# Patient Record
Sex: Female | Born: 1995 | Race: White | Hispanic: No | Marital: Single | Smoking: Former smoker
Health system: Southern US, Community
[De-identification: ages and names within clinical notes are randomized; demographics above are authoritative.]

## PROBLEM LIST (undated history)

## (undated) ENCOUNTER — Inpatient Hospital Stay (HOSPITAL_COMMUNITY): Payer: Self-pay

## (undated) DIAGNOSIS — G43909 Migraine, unspecified, not intractable, without status migrainosus: Secondary | ICD-10-CM

## (undated) DIAGNOSIS — M549 Dorsalgia, unspecified: Secondary | ICD-10-CM

## (undated) HISTORY — PX: NO PAST SURGERIES: SHX2092

---
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
## 2018-02-12 ENCOUNTER — Encounter: Payer: Self-pay | Admitting: Family Medicine

## 2018-02-12 ENCOUNTER — Ambulatory Visit (INDEPENDENT_AMBULATORY_CARE_PROVIDER_SITE_OTHER): Payer: Medicaid Other | Admitting: *Deleted

## 2018-02-12 DIAGNOSIS — Z3200 Encounter for pregnancy test, result unknown: Secondary | ICD-10-CM

## 2018-02-12 DIAGNOSIS — Z3201 Encounter for pregnancy test, result positive: Secondary | ICD-10-CM | POA: Diagnosis not present

## 2018-02-12 LAB — POCT PREGNANCY, URINE: Preg Test, Ur: POSITIVE — AB

## 2018-02-12 NOTE — Progress Notes (Signed)
I have reviewed this chart and agree with the RN/CMA assessment and management.    Josean Lycan C Nkechi Linehan, MD, FACOG Attending Physician, [facility]  

## 2018-02-12 NOTE — Progress Notes (Signed)
Here for pregnancy test which was positive. LMP 12/15/17 or could have been 12/8 or 12/10 . This makes her [redacted]w[redacted]d with EDD 09/21/18. Advised to start prenatal care and prenatal vitamins. She is not on any meds currently.  Linda,RN

## 2018-02-15 ENCOUNTER — Encounter (HOSPITAL_COMMUNITY): Payer: Self-pay | Admitting: *Deleted

## 2018-02-15 ENCOUNTER — Inpatient Hospital Stay (HOSPITAL_COMMUNITY)
Admission: AD | Admit: 2018-02-15 | Discharge: 2018-02-15 | Disposition: A | Discharge status: Home / Self Care | Payer: Medicaid Other | Source: Ambulatory Visit | Attending: Obstetrics & Gynecology | Admitting: Obstetrics & Gynecology

## 2018-02-15 ENCOUNTER — Inpatient Hospital Stay (HOSPITAL_COMMUNITY): Payer: Medicaid Other

## 2018-02-15 DIAGNOSIS — O3680X0 Pregnancy with inconclusive fetal viability, not applicable or unspecified: Secondary | ICD-10-CM | POA: Diagnosis not present

## 2018-02-15 DIAGNOSIS — R109 Unspecified abdominal pain: Secondary | ICD-10-CM | POA: Insufficient documentation

## 2018-02-15 DIAGNOSIS — Z3A08 8 weeks gestation of pregnancy: Secondary | ICD-10-CM | POA: Insufficient documentation

## 2018-02-15 DIAGNOSIS — O26899 Other specified pregnancy related conditions, unspecified trimester: Secondary | ICD-10-CM

## 2018-02-15 DIAGNOSIS — O26891 Other specified pregnancy related conditions, first trimester: Secondary | ICD-10-CM

## 2018-02-15 DIAGNOSIS — Z6790 Unspecified blood type, Rh positive: Secondary | ICD-10-CM

## 2018-02-15 DIAGNOSIS — Z3A01 Less than 8 weeks gestation of pregnancy: Secondary | ICD-10-CM | POA: Diagnosis not present

## 2018-02-15 DIAGNOSIS — O209 Hemorrhage in early pregnancy, unspecified: Secondary | ICD-10-CM | POA: Diagnosis present

## 2018-02-15 LAB — URINALYSIS, MICROSCOPIC (REFLEX)
Bacteria, UA: NONE SEEN
WBC, UA: NONE SEEN WBC/hpf (ref 0–5)

## 2018-02-15 LAB — URINALYSIS, ROUTINE W REFLEX MICROSCOPIC
BILIRUBIN URINE: NEGATIVE
Glucose, UA: NEGATIVE mg/dL
Ketones, ur: NEGATIVE mg/dL
Leukocytes, UA: NEGATIVE
Nitrite: NEGATIVE
Protein, ur: NEGATIVE mg/dL
SPECIFIC GRAVITY, URINE: 1.025 (ref 1.005–1.030)
pH: 5.5 (ref 5.0–8.0)

## 2018-02-15 LAB — WET PREP, GENITAL
Clue Cells Wet Prep HPF POC: NONE SEEN
SPERM: NONE SEEN
TRICH WET PREP: NONE SEEN
WBC WET PREP: NONE SEEN
YEAST WET PREP: NONE SEEN

## 2018-02-15 LAB — CBC
HEMATOCRIT: 38.5 % (ref 36.0–46.0)
HEMOGLOBIN: 12.4 g/dL (ref 12.0–15.0)
MCH: 27.9 pg (ref 26.0–34.0)
MCHC: 32.2 g/dL (ref 30.0–36.0)
MCV: 86.7 fL (ref 80.0–100.0)
NRBC: 0 % (ref 0.0–0.2)
Platelets: 241 10*3/uL (ref 150–400)
RBC: 4.44 MIL/uL (ref 3.87–5.11)
RDW: 14.4 % (ref 11.5–15.5)
WBC: 7.4 10*3/uL (ref 4.0–10.5)

## 2018-02-15 LAB — HCG, QUANTITATIVE, PREGNANCY: hCG, Beta Chain, Quant, S: 1527 m[IU]/mL — ABNORMAL HIGH (ref <5)

## 2018-02-15 LAB — ABO/RH: ABO/RH(D): O POS

## 2018-02-15 NOTE — MAU Note (Signed)
Samantha Andrews is a 23 y.o.  here in MAU reporting:  +vaginal bleeding: spotting. Not having to wear a pad. At first was brown and now red when wipes. LMP: 12/15/17 +lower abdominal cramping earlier but not now. Onset of complaint: x2 days Pain score: denies at this time. 5/10 earlier. Vitals:   02/15/18 0718  BP: 134/76  Pulse: 68  Resp: 18  SpO2: 100%

## 2018-02-15 NOTE — MAU Provider Note (Signed)
History     CSN: [id number]  Arrival date and time: 02/15/18 [id number]   First Provider Initiated Contact with Patient 02/15/18 [phone]      Chief Complaint  Patient presents with  . Vaginal Bleeding   G1 @[redacted]w[redacted]d  by LMP presenting with VB and cramping. Bleeding started 2 days ago. Was brown initially but became red today. Mostly when she wipes. Has not needed a pad. Cramping started today. Located in lower abdomen, intermittent. Feels like period cramps. Has not taken anything for it. Denies urinary sx.    OB History    Gravida  1   Para      Term      Preterm      AB      Living        SAB      TAB      Ectopic      Multiple      Live Births              Past Medical History:  Diagnosis Date  . Back pain   . Migraines     Past Surgical History:  Procedure Laterality Date  . NO PAST SURGERIES      History reviewed. No pertinent family history.  Social History   Tobacco Use  . Smoking status: Never Smoker  . Smokeless tobacco: Never Used  Substance Use Topics  . Alcohol use: No  . Drug use: Yes    Types: Marijuana    Allergies: No Known Allergies  No medications prior to admission.    Review of Systems  Constitutional: Negative for chills and fever.  Gastrointestinal: Positive for abdominal pain, constipation and nausea. Negative for diarrhea and vomiting.  Genitourinary: Positive for vaginal bleeding. Negative for dysuria and hematuria.  Musculoskeletal: Negative for back pain.   Physical Exam   Blood pressure 128/76, pulse 90, temperature 98.4 F (36.9 C), resp. rate 18, weight 83 kg, last menstrual period 12/15/2017, SpO2 100 %.  Physical Exam  Constitutional: She is oriented to person, place, and time. She appears well-developed and well-nourished. No distress.  HENT:  Head: Normocephalic and atraumatic.  Neck: Normal range of motion.  Cardiovascular: Normal rate.  Respiratory: Effort normal. No respiratory distress.  GI: Soft. She  exhibits no distension and no mass. There is no abdominal tenderness. There is no rebound and no guarding.  Genitourinary:    Genitourinary Comments: External: no lesions or erythema Vagina: rugated, pink, moist, small drk bloody discharge w/tiny clots Uterus: non enlarged, anteverted, non tender, no CMT Adnexae: no masses, no tenderness left, no tenderness right Cervix closed    Musculoskeletal: Normal range of motion.  Neurological: She is alert and oriented to person, place, and time.  Skin: Skin is warm and dry.  Psychiatric: She has a normal mood and affect.   Results for orders placed or performed during the hospital encounter of 02/15/18 (from the past 24 hour(s))  Wet prep, genital     Status: None   Collection Time: 02/15/18  8:22 AM  Result Value Ref Range   Yeast Wet Prep HPF POC NONE SEEN NONE SEEN   Trich, Wet Prep NONE SEEN NONE SEEN   Clue Cells Wet Prep HPF POC NONE SEEN NONE SEEN   WBC, Wet Prep HPF POC NONE SEEN NONE SEEN   Sperm NONE SEEN   Urinalysis, Routine w reflex microscopic     Status: Abnormal   Collection Time: 02/15/18  8:25 AM  Result Value Ref  Range   Color, Urine YELLOW YELLOW   APPearance CLEAR CLEAR   Specific Gravity, Urine 1.025 1.005 - 1.030   pH 5.5 5.0 - 8.0   Glucose, UA NEGATIVE NEGATIVE mg/dL   Hgb urine dipstick LARGE (A) NEGATIVE   Bilirubin Urine NEGATIVE NEGATIVE   Ketones, ur NEGATIVE NEGATIVE mg/dL   Protein, ur NEGATIVE NEGATIVE mg/dL   Nitrite NEGATIVE NEGATIVE   Leukocytes, UA NEGATIVE NEGATIVE  Urinalysis, Microscopic (reflex)     Status: None   Collection Time: 02/15/18  8:25 AM  Result Value Ref Range   RBC / HPF 11-20 0 - 5 RBC/hpf   WBC, UA NONE SEEN 0 - 5 WBC/hpf   Bacteria, UA NONE SEEN NONE SEEN   Squamous Epithelial / LPF 0-5 0 - 5   Mucus PRESENT   CBC     Status: None   Collection Time: 02/15/18  8:33 AM  Result Value Ref Range   WBC 7.4 4.0 - 10.5 K/uL   RBC 4.44 3.87 - 5.11 MIL/uL   Hemoglobin 12.4 12.0 -  15.0 g/dL   HCT 26.8 34.1 - 96.2 %   MCV 86.7 80.0 - 100.0 fL   MCH 27.9 26.0 - 34.0 pg   MCHC 32.2 30.0 - 36.0 g/dL   RDW 22.9 79.8 - 92.1 %   Platelets 241 150 - 400 K/uL   nRBC 0.0 0.0 - 0.2 %  hCG, quantitative, pregnancy     Status: Abnormal   Collection Time: 02/15/18  8:33 AM  Result Value Ref Range   hCG, Beta Chain, Quant, S 1,527 (H) <5 mIU/mL  ABO/Rh     Status: None (Preliminary result)   Collection Time: 02/15/18  8:33 AM  Result Value Ref Range   ABO/RH(D)      O POS Performed at [facility], [address] [place], [place] [address]    US Ob Less Than 14 Weeks With Ob Transvaginal  Result Date: 02/15/2018 CLINICAL DATA:  Pregnant with abdominal pain. EXAM: OBSTETRIC <14 WK [place] AND TRANSVAGINAL OB US TECHNIQUE: Both transabdominal and transvaginal ultrasound examinations were performed for complete evaluation of the gestation as well as the maternal uterus, adnexal regions, and pelvic cul-de-sac. Transvaginal technique was performed to assess early pregnancy. COMPARISON:  None. FINDINGS: Intrauterine gestational sac: Present Yolk sac:  None Embryo:  None Cardiac Activity: N/A Heart Rate: N/A bpm MSD: 6.4 mm   5 w   2 d Subchorionic hemorrhage:  None visualized. Maternal uterus/adnexae: The ovaries are normal. IMPRESSION: Probable early intrauterine gestational sac, but no yolk sac or embryonic pole. Recommend follow-up quantitative B-HCG levels and follow-up US in 14 days to assess viability. This recommendation follows SRU consensus guidelines: Diagnostic Criteria for Nonviable Pregnancy Early in the First Trimester. Malva Limes Med 2013; 408:1448-18. Electronically Signed   By: Rudie Meyer M.D.   On: 02/15/2018 09:22    MAU Course  Procedures Orders Placed This Encounter  Procedures  . Wet prep, genital    Standing Status:   Standing    Number of Occurrences:   1    Order Specific Question:   Patient immune status    Answer:   Normal  . US OB LESS THAN 14 WEEKS  WITH OB TRANSVAGINAL    Standing Status:   Standing    Number of Occurrences:   1    Order Specific Question:   Symptom/Reason for Exam    Answer:   Abdominal pain affecting pregnancy [[id number]]  . Urinalysis,  Routine w reflex microscopic    Standing Status:   Standing    Number of Occurrences:   1  . CBC    Standing Status:   Standing    Number of Occurrences:   1  . hCG, quantitative, pregnancy    Standing Status:   Standing    Number of Occurrences:   1  . Urinalysis, Microscopic (reflex)    Standing Status:   Standing    Number of Occurrences:   1  . ABO/Rh    Standing Status:   Standing    Number of Occurrences:   1  . Discharge patient    Order Specific Question:   Discharge disposition    Answer:   01-Home or Self Care [1]    Order Specific Question:   Discharge patient date    Answer:   02/15/2018   MDM Labs and [place] ordered and reviewed. IUGS smaller than dates seen on [place] but no YS, FP, or adnexal mass, suspect failed pregnancy but findings could indicate early pregnancy or ectopic pregnancy-discussed with pt. Will follow quant in 48 hrs. Stable for discharge home.  Assessment and Plan   1. Pregnancy, location unknown   2. Abdominal pain affecting pregnancy   3. Blood type, Rh positive    Discharge home Follow up in [facility] in 2 days for qhcg Ectopic/SAB precautions  Allergies as of 02/15/2018   No Known Allergies     Medication List    STOP taking these medications   ibuprofen 200 MG tablet Commonly known as:  ADVIL,MOTRIN     TAKE these medications   citalopram 20 MG tablet Commonly known as:  CELEXA Take 20 mg by mouth daily.   ondansetron 4 MG disintegrating tablet Commonly known as:  ZOFRAN ODT 4mg  ODT q4 hours prn nausea/vomit   SUMAtriptan 100 MG tablet Commonly known as:  IMITREX Take 100 mg by mouth every 2 (two) hours as needed for migraine. May repeat in 2 hours if headache persists or recurs.      Donette Larry, CNM 02/15/2018, 9:52 AM

## 2018-02-15 NOTE — Discharge Instructions (Signed)
Vaginal Bleeding During Pregnancy, First Trimester    A small amount of bleeding (spotting) from the vagina is common during early pregnancy. Sometimes the bleeding is normal and does not cause problems. At other times, though, bleeding may be a sign of something serious. Tell your doctor about any bleeding from your vagina right away.  Follow these instructions at home:  Activity  · Follow your doctor's instructions about how active you can be.  · If needed, make plans for someone to help with your normal activities.  · Do not have sex or orgasms until your doctor says that this is safe.  General instructions  · Take over-the-counter and prescription medicines only as told by your doctor.  · Watch your condition for any changes.  · Write down:  ? The number of pads you use each day.  ? How often you change pads.  ? How soaked (saturated) your pads are.  · Do not use tampons.  · Do not douche.  · If you pass any tissue from your vagina, save it to show to your doctor.  · Keep all follow-up visits as told by your doctor. This is important.  Contact a doctor if:  · You have vaginal bleeding at any time while you are pregnant.  · You have cramps.  · You have a fever.  Get help right away if:  · You have very bad cramps in your back or belly (abdomen).  · You pass large clots or a lot of tissue from your vagina.  · Your bleeding gets worse.  · You feel light-headed.  · You feel weak.  · You pass out (faint).  · You have chills.  · You are leaking fluid from your vagina.  · You have a gush of fluid from your vagina.  Summary  · Sometimes vaginal bleeding during pregnancy is normal and does not cause problems. At other times, bleeding may be a sign of something serious.  · Tell your doctor about any bleeding from your vagina right away.  · Follow your doctor's instructions about how active you can be. You may need someone to help you with your normal activities.  This information is not intended to replace advice given to  you by your health care provider. Make sure you discuss any questions you have with your health care provider.  Document Released: 05/11/2013 Document Revised: 03/28/2016 Document Reviewed: 03/28/2016  Elsevier Interactive Patient Education © 2019 [employer]

## 2018-02-17 ENCOUNTER — Ambulatory Visit (INDEPENDENT_AMBULATORY_CARE_PROVIDER_SITE_OTHER): Payer: Medicaid Other | Admitting: *Deleted

## 2018-02-17 DIAGNOSIS — O3680X0 Pregnancy with inconclusive fetal viability, not applicable or unspecified: Secondary | ICD-10-CM

## 2018-02-17 DIAGNOSIS — O0289 Other abnormal products of conception: Secondary | ICD-10-CM

## 2018-02-17 LAB — HCG, QUANTITATIVE, PREGNANCY: HCG, BETA CHAIN, QUANT, S: 1780 m[IU]/mL — AB (ref <5)

## 2018-02-17 LAB — GC/CHLAMYDIA PROBE AMP (~~LOC~~) NOT AT ARMC
Chlamydia: POSITIVE — AB
Neisseria Gonorrhea: NEGATIVE

## 2018-02-17 MED ORDER — MISOPROSTOL 200 MCG PO TABS: ORAL_TABLET | ORAL | 1 refills | Status: DC

## 2018-02-17 MED ORDER — MISOPROSTOL 200 MCG PO TABS
ORAL_TABLET | ORAL | 1 refills | Status: DC
Start: 2018-02-17 — End: 2018-02-17

## 2018-02-17 NOTE — Progress Notes (Signed)
Here for stat bhcg. Denies pain; states only having mild cramps like with period. C/o  Bleeding like a period since 02/15/18 mau visit. States it feels like a period. Explained we will draw stat bhcg and have her wait in lobby for results which we will then discuss with provider and then her. She voices understanding.  Kay Ricciuti,RN

## 2018-02-17 NOTE — Progress Notes (Signed)
Reviewed results with Dr. Adrian Blackwater and informed patient her bhcg indicates inappropriate rise in bhcg and indicated non viable pregnancy..  Informed her we recommend cytotec and then follow up in 2 weeks.Explained she will pass blood , clots and perhaps tissue within 48 hours- if she does not she should repeat the cytotec.   Support given. She voices understanding.  Legrand Como

## 2018-02-17 NOTE — Progress Notes (Signed)
I reviewed the chart with RN. Chart reviewed - agree with RN documentation.

## 2018-02-19 ENCOUNTER — Telehealth: Payer: Self-pay | Admitting: Certified Nurse Midwife

## 2018-02-19 NOTE — Telephone Encounter (Signed)
Duplicate. Attempt made to contact pt previously today by RN.

## 2018-02-20 ENCOUNTER — Encounter (HOSPITAL_COMMUNITY): Payer: Self-pay | Admitting: Student

## 2018-02-20 DIAGNOSIS — A749 Chlamydial infection, unspecified: Secondary | ICD-10-CM | POA: Insufficient documentation

## 2018-02-20 HISTORY — DX: Chlamydial infection, unspecified: A74.9

## 2018-03-04 ENCOUNTER — Ambulatory Visit: Payer: Medicaid Other | Admitting: Student

## 2018-07-16 ENCOUNTER — Encounter: Payer: Self-pay | Admitting: *Deleted

## 2018-07-16 ENCOUNTER — Other Ambulatory Visit: Payer: Self-pay

## 2018-07-16 ENCOUNTER — Emergency Department: Payer: Medicaid Other

## 2018-07-16 DIAGNOSIS — O200 Threatened abortion: Secondary | ICD-10-CM | POA: Diagnosis not present

## 2018-07-16 DIAGNOSIS — Z79899 Other long term (current) drug therapy: Secondary | ICD-10-CM | POA: Diagnosis not present

## 2018-07-16 DIAGNOSIS — Z3A01 Less than 8 weeks gestation of pregnancy: Secondary | ICD-10-CM | POA: Diagnosis not present

## 2018-07-16 DIAGNOSIS — O209 Hemorrhage in early pregnancy, unspecified: Secondary | ICD-10-CM | POA: Diagnosis present

## 2018-07-16 LAB — CBC
HCT: 35.6 % — ABNORMAL LOW (ref 36.0–46.0)
Hemoglobin: 11.4 g/dL — ABNORMAL LOW (ref 12.0–15.0)
MCH: 26.6 pg (ref 26.0–34.0)
MCHC: 32 g/dL (ref 30.0–36.0)
MCV: 83 fL (ref 80.0–100.0)
Platelets: 271 10*3/uL (ref 150–400)
RBC: 4.29 MIL/uL (ref 3.87–5.11)
RDW: 13.1 % (ref 11.5–15.5)
WBC: 11.5 10*3/uL — ABNORMAL HIGH (ref 4.0–10.5)
nRBC: 0 % (ref 0.0–0.2)

## 2018-07-16 LAB — COMPREHENSIVE METABOLIC PANEL
ALT: 17 U/L (ref 0–44)
AST: 19 U/L (ref 15–41)
Albumin: 4.3 g/dL (ref 3.5–5.0)
Alkaline Phosphatase: 51 U/L (ref 38–126)
Anion gap: 5 (ref 5–15)
BUN: 9 mg/dL (ref 6–20)
CO2: 27 mmol/L (ref 22–32)
Calcium: 9.2 mg/dL (ref 8.9–10.3)
Chloride: 106 mmol/L (ref 98–111)
Creatinine, Ser: 0.62 mg/dL (ref 0.44–1.00)
GFR calc Af Amer: >60 mL/min (ref >60)
GFR calc non Af Amer: >60 mL/min (ref >60)
Glucose, Bld: 84 mg/dL (ref 70–99)
Potassium: 3.7 mmol/L (ref 3.5–5.1)
Sodium: 138 mmol/L (ref 135–145)
Total Bilirubin: 0.4 mg/dL (ref 0.3–1.2)
Total Protein: 7.2 g/dL (ref 6.5–8.1)

## 2018-07-16 LAB — ABO/RH: ABO/RH(D): O POS

## 2018-07-16 LAB — HCG, QUANTITATIVE, PREGNANCY: hCG, Beta Chain, Quant, S: 2046 m[IU]/mL — ABNORMAL HIGH (ref <5)

## 2018-07-16 LAB — LIPASE, BLOOD: Lipase: 33 U/L (ref 11–51)

## 2018-07-16 NOTE — ED Triage Notes (Signed)
Pt to ED reporting slow vaginal bleeding throughout the day today that has turned into  Sharp pelvic and lower abd pains within the last hour with blood clots noted the last time pt went to the bathroom. Pt reports a miscarriage earlier this year that felt similar. Pt tearful in triage and states she just found out she was pregnant this weekend and denies knowing how far along she is.

## 2018-07-17 ENCOUNTER — Emergency Department
Admission: EM | Admit: 2018-07-17 | Discharge: 2018-07-17 | Disposition: A | Discharge status: Home / Self Care | Payer: Medicaid Other | Attending: Emergency Medicine | Admitting: Emergency Medicine

## 2018-07-17 DIAGNOSIS — O4690 Antepartum hemorrhage, unspecified, unspecified trimester: Secondary | ICD-10-CM

## 2018-07-17 DIAGNOSIS — O200 Threatened abortion: Secondary | ICD-10-CM

## 2018-07-17 NOTE — ED Provider Notes (Signed)
[facility] Emergency Department Provider Note   ____________________________________________   First MD Initiated Contact with Patient 07/17/18 0240     (approximate)  I have reviewed the triage vital signs and the nursing notes.   HISTORY  Chief Complaint Vaginal Bleeding    HPI Samantha Andrews is a 23 y.o. female G2 P0 Ab1 who presents with positive home pregnancy test, vaginal spotting and pelvic cramps.  Patient reports miscarriage in January of this year.  Last menstrual period around May 24.  Denies recent sexual intercourse, vaginal discharge, dizziness, fever, cough, chest pain, shortness of breath.  Denies recent travel, trauma or exposure to persons diagnosed with coronavirus.       Past Medical History:  Diagnosis Date  . Back pain   . Migraines     Patient Active Problem List   Diagnosis Date Noted  . Chlamydia 02/20/2018    Past Surgical History:  Procedure Laterality Date  . NO PAST SURGERIES      Prior to Admission medications   Medication Sig Start Date End Date Taking? Authorizing Provider  citalopram (CELEXA) 20 MG tablet Take 20 mg by mouth daily.    [provider]  misoprostol (CYTOTEC) 200 MCG tablet Place 2 tablets in each side of mouth between gum and cheek and allow to dissolve. May repeat in 3 days if no results. 02/17/18   Levie HeritageStinson, Jacob J, DO  ondansetron (ZOFRAN ODT) 4 MG disintegrating tablet 4mg  ODT q4 hours prn nausea/vomit 03/19/15   Blane OharaZavitz, Joshua, MD  SUMAtriptan (IMITREX) 100 MG tablet Take 100 mg by mouth every 2 (two) hours as needed for migraine. May repeat in 2 hours if headache persists or recurs.    [provider]    Allergies Patient has no known allergies.  History reviewed. No pertinent family history.  Social History Social History   Tobacco Use  . Smoking status: Never Smoker  . Smokeless tobacco: Never Used  Substance Use Topics  . Alcohol use: No  . Drug use:  Yes    Types: Marijuana    Review of Systems  Constitutional: No fever/chills Eyes: No visual changes. ENT: No sore throat. Cardiovascular: Denies chest pain. Respiratory: Denies shortness of breath. Gastrointestinal: Positive for pelvic cramps.  No abdominal pain.  No nausea, no vomiting.  No diarrhea.  No constipation. Genitourinary: Positive for vaginal bleeding.  Negative for dysuria. Musculoskeletal: Negative for back pain. Skin: Negative for rash. Neurological: Negative for headaches, focal weakness or numbness.   ____________________________________________   PHYSICAL EXAM:  VITAL SIGNS: ED Triage Vitals [07/16/18 2226]  Enc Vitals Group     BP 139/71     Pulse Rate (!) 115     Resp 16     Temp 98.7 F (37.1 C)     Temp Source Oral     SpO2 100 %     Weight 180 lb (81.6 kg)     Height 5\' 3"  (1.6 m)     Head Circumference      Peak Flow      Pain Score 8     Pain Loc      Pain Edu?      Excl. in [place]?     Constitutional: Asleep, awakened for exam.  Alert and oriented. Well appearing and in no acute distress. Eyes: Conjunctivae are normal. PERRL. EOMI. Head: Atraumatic. Nose: No congestion/rhinnorhea. Mouth/Throat: Mucous membranes are moist.  Oropharynx non-erythematous. Neck: No stridor.   Cardiovascular: Normal rate, regular rhythm. Grossly  normal heart sounds.  Good peripheral circulation. Respiratory: Normal respiratory effort.  No retractions. Lungs CTAB. Gastrointestinal: Soft and nontender to light or deep palpation. No distention. No abdominal bruits. No CVA tenderness. Musculoskeletal: No lower extremity tenderness nor edema.  No joint effusions. Neurologic:  Normal speech and language. No gross focal neurologic deficits are appreciated. No gait instability. Skin:  Skin is warm, dry and intact. No rash noted. Psychiatric: Mood and affect are normal. Speech and behavior are normal.  ____________________________________________   LABS (all labs  ordered are listed, but only abnormal results are displayed)  Labs Reviewed  CBC - Abnormal; Notable for the following components:      Result Value   WBC 11.5 (*)    Hemoglobin 11.4 (*)    HCT 35.6 (*)    All other components within normal limits  HCG, QUANTITATIVE, PREGNANCY - Abnormal; Notable for the following components:   hCG, Beta Chain, Quant, S 2,046 (*)    All other components within normal limits  LIPASE, BLOOD  COMPREHENSIVE METABOLIC PANEL  URINALYSIS, COMPLETE (UACMP) WITH MICROSCOPIC  ABO/RH   ____________________________________________  EKG  None ____________________________________________  RADIOLOGY  ED MD interpretation: Probable early intrauterine gestational sac but no cardiac activity yet visualized.  Official radiology report(s): [place] Ob Comp Less 14 Wks  Result Date: 07/17/2018 CLINICAL DATA:  Vaginal bleeding EXAM: OBSTETRIC <14 WK US AND TRANSVAGINAL OB US TECHNIQUE: Both transabdominal and transvaginal ultrasound examinations were performed for complete evaluation of the gestation as well as the maternal uterus, adnexal regions, and pelvic cul-de-sac. Transvaginal technique was performed to assess early pregnancy. COMPARISON:  02/15/2018 FINDINGS: Intrauterine gestational sac: Single Yolk sac:  Not Visualized. Embryo:  Not Visualized. Cardiac Activity: Not Visualized. MSD: 5.1 mm   5 w   2 d Subchorionic hemorrhage:  None visualized. Maternal uterus/adnexae: There is no acute maternal abnormality. IMPRESSION: Probable early intrauterine gestational sac, but no yolk sac, fetal pole, or cardiac activity yet visualized. Recommend follow-up quantitative B-HCG levels and follow-up US in 14 days to assess viability. This recommendation follows SRU consensus guidelines: Diagnostic Criteria for Nonviable Pregnancy Early in the First Trimester. Malva Limes Engl J Med 2013; 782:9562-13; 369:1443-51. Electronically Signed   By: Katherine Mantlehristopher  Green M.D.   On: 07/17/2018 00:36   Koreas Ob  Transvaginal  Result Date: 07/17/2018 CLINICAL DATA:  Vaginal bleeding EXAM: OBSTETRIC <14 WK US AND TRANSVAGINAL OB US TECHNIQUE: Both transabdominal and transvaginal ultrasound examinations were performed for complete evaluation of the gestation as well as the maternal uterus, adnexal regions, and pelvic cul-de-sac. Transvaginal technique was performed to assess early pregnancy. COMPARISON:  02/15/2018 FINDINGS: Intrauterine gestational sac: Single Yolk sac:  Not Visualized. Embryo:  Not Visualized. Cardiac Activity: Not Visualized. MSD: 5.1 mm   5 w   2 d Subchorionic hemorrhage:  None visualized. Maternal uterus/adnexae: There is no acute maternal abnormality. IMPRESSION: Probable early intrauterine gestational sac, but no yolk sac, fetal pole, or cardiac activity yet visualized. Recommend follow-up quantitative B-HCG levels and follow-up US in 14 days to assess viability. This recommendation follows SRU consensus guidelines: Diagnostic Criteria for Nonviable Pregnancy Early in the First Trimester. Malva Limes Engl J Med 2013; [id number]:5784-69; 369:1443-51. Electronically Signed   By: Katherine Mantlehristopher  Green M.D.   On: 07/17/2018 00:36    ____________________________________________   PROCEDURES  Procedure(s) performed (including Critical Care):  Procedures  Pelvic deferred; patient states she is no longer bleeding ____________________________________________   INITIAL IMPRESSION / ASSESSMENT AND PLAN / ED COURSE  As part of my medical  decision making, I reviewed the following data within the [place] notes reviewed and incorporated, Labs reviewed, Old chart reviewed, Radiograph reviewed and Notes from prior ED visits     Samantha Andrews was evaluated in Emergency Department on 07/17/2018 for the symptoms described in the history of present illness. She was evaluated in the context of the global COVID-19 pandemic, which necessitated consideration that the patient might be at risk for  infection with the SARS-CoV-2 virus that causes COVID-19. Institutional protocols and algorithms that pertain to the evaluation of patients at risk for COVID-19 are in a state of rapid change based on information released by regulatory bodies including the CDC and federal and state organizations. These policies and algorithms were followed during the patient's care in the ED.   23 year old G2, P0 approximately [redacted] weeks pregnant who presents with vaginal spotting. Differential diagnosis includes, but is not limited to, ovarian cyst, ovarian torsion, acute appendicitis, diverticulitis, urinary tract infection/pyelonephritis, endometriosis, bowel obstruction, colitis, renal colic, gastroenteritis, hernia, fibroids, endometriosis, pregnancy related pain including ectopic pregnancy, etc.  Discussed with patient lab work and ultrasound results.  She will return to outpatient laboratory in 48 hours for repeat beta-hCG.  She will follow-up either with [facility] in [place] as she has previously or local referral provided.  Strict return precautions given.  Patient verbalizes understanding agrees with plan of care.      ____________________________________________   FINAL CLINICAL IMPRESSION(S) / ED DIAGNOSES  Final diagnoses:  Vaginal bleeding in pregnancy  Threatened miscarriage     ED Discharge Orders    None       Note:  This document was prepared using Dragon voice recognition software and may include unintentional dictation errors.   Paulette Blanch, MD 07/17/18 [phone]

## 2018-07-17 NOTE — ED Notes (Signed)
ED Provider at bedside. 

## 2018-07-17 NOTE — Discharge Instructions (Addendum)
Plenty of fluids daily and practice pelvic rest.  You may return to outpatient lab on Saturday between  8 AM to noon for repeat blood draw.  Your blood pregnancy level tonight is 2,046. Return to the ER for worsening symptoms, soaking more than 1 pad per hour, fainting or other concerns.

## 2018-07-21 ENCOUNTER — Telehealth: Payer: Self-pay | Admitting: Family Medicine

## 2018-07-21 NOTE — Telephone Encounter (Signed)
The patient stated she missed her appointment at the ER. She also stated she has been around someone that tested positive for Covid19. She stated the last time she was near the patient was Friday. Informed the patient of coming to the window once she enters the clinic. Also be sure to wear a face mask and no visitors/children allowed due to restrictions.

## 2018-07-22 ENCOUNTER — Telehealth: Payer: Self-pay | Admitting: Obstetrics and Gynecology

## 2018-07-22 ENCOUNTER — Ambulatory Visit (INDEPENDENT_AMBULATORY_CARE_PROVIDER_SITE_OTHER): Payer: Self-pay | Admitting: General Practice

## 2018-07-22 ENCOUNTER — Other Ambulatory Visit: Payer: Medicaid Other

## 2018-07-22 ENCOUNTER — Other Ambulatory Visit: Payer: Self-pay

## 2018-07-22 DIAGNOSIS — O3680X0 Pregnancy with inconclusive fetal viability, not applicable or unspecified: Secondary | ICD-10-CM | POA: Diagnosis not present

## 2018-07-22 LAB — BETA HCG QUANT (REF LAB): hCG Quant: 81 m[IU]/mL

## 2018-07-22 NOTE — Telephone Encounter (Signed)
Called the patient to inform of the stat being completed from her car. Informed the patient to call our office once she arrives and we will complete the visit at the time. The patient verbalized understanding.

## 2018-07-22 NOTE — Progress Notes (Signed)
Subjective:  Samantha Andrews is a 23 y.o. G1P0 at Unknown who presents today for FU BHCG. She was seen on 07/16/2018.  Results from that day show no IUP on [place], gestational sac only, and HCG 2046. She reports light vaginal bleeding. She denies abdominal or pelvic pain.   Objective:  Physical Exam  Nursing note and vitals reviewed. Constitutional: She is oriented to person, place, and time. She appears    Results for orders placed or performed in visit on 07/22/18 (from the past 24 hour(s))  Beta hCG quant (ref lab)     Status: None   Collection Time: 07/22/18 11:17 AM  Result Value Ref Range   hCG Quant 81 mIU/mL   Narrative   Performed at:  [facility] [address], [place], [place]  [id number] Lab Director: Lindon Romp MD, Phone:  [phone]    Assessment/Plan: Pregnancy of unknown location: SAB in process  HCG did not rise appropriately.  FU in 1 weeks for: Stat Quant. Appointment made for Wednesday 7/22 @ 1400  Rasch, Artist Pais, NP 07/22/2018 1:31 PM

## 2018-07-22 NOTE — Telephone Encounter (Signed)
Called the patient to inform of the procedure with the stat completion based on the pre-screening provided from 07/21/2018 as a possible covid19 patient. Left the patient a detailed voicemail of returning the call to our office and also calling our office when she arrives.

## 2018-07-22 NOTE — Progress Notes (Signed)
Patient presents to office today for stat bhcg following ER visit on 7/9. Patient states she started bleeding like a period after leaving the ER and has been since then- also reports mild cramps. Discussed with patient we are monitoring your bhcg levels today, results/history will be reviewed with a provider in the office and we will call you with results/updated plan of care in a couple of hours. Patient verbalized understanding & provided callback number [phone].  Koren Bound RN BSN 07/22/18

## 2018-07-29 ENCOUNTER — Telehealth: Payer: Self-pay | Admitting: Family Medicine

## 2018-07-29 NOTE — Telephone Encounter (Signed)
Attempted to call patient about her appointment on 7/22 @ 2:00. No answer, left voicemail instructing patient to wear a face mask for the entire appointment and no visitors are allowed during the visit. Patient instructed not to attend the appointment if she was any symptoms. Symptom list and office number left.

## 2018-07-30 ENCOUNTER — Ambulatory Visit: Payer: Medicaid Other | Admitting: *Deleted

## 2018-07-30 ENCOUNTER — Other Ambulatory Visit: Payer: Self-pay

## 2018-07-30 DIAGNOSIS — O039 Complete or unspecified spontaneous abortion without complication: Secondary | ICD-10-CM | POA: Diagnosis not present

## 2018-07-31 LAB — BETA HCG QUANT (REF LAB): hCG Quant: 2 m[IU]/mL

## 2018-10-16 ENCOUNTER — Other Ambulatory Visit: Payer: Self-pay

## 2018-10-16 DIAGNOSIS — Z20822 Contact with and (suspected) exposure to covid-19: Secondary | ICD-10-CM

## 2018-10-16 DIAGNOSIS — Z20828 Contact with and (suspected) exposure to other viral communicable diseases: Secondary | ICD-10-CM | POA: Diagnosis not present

## 2018-10-17 LAB — NOVEL CORONAVIRUS, NAA: SARS-CoV-2, NAA: NOT DETECTED

## 2018-12-26 ENCOUNTER — Other Ambulatory Visit: Payer: Self-pay

## 2018-12-26 ENCOUNTER — Ambulatory Visit: Payer: Medicaid Other | Attending: Internal Medicine

## 2018-12-26 ENCOUNTER — Other Ambulatory Visit: Payer: Medicaid Other

## 2018-12-26 DIAGNOSIS — Z20822 Contact with and (suspected) exposure to covid-19: Secondary | ICD-10-CM

## 2018-12-26 DIAGNOSIS — Z20828 Contact with and (suspected) exposure to other viral communicable diseases: Secondary | ICD-10-CM | POA: Diagnosis not present

## 2018-12-27 LAB — NOVEL CORONAVIRUS, NAA: SARS-CoV-2, NAA: NOT DETECTED

## 2019-04-27 ENCOUNTER — Emergency Department
Admission: EM | Admit: 2019-04-27 | Discharge: 2019-04-27 | Discharge status: Against Medical Advice | Payer: Medicaid Other

## 2019-04-27 DIAGNOSIS — G43901 Migraine, unspecified, not intractable, with status migrainosus: Secondary | ICD-10-CM | POA: Diagnosis not present

## 2019-04-27 NOTE — ED Notes (Signed)
Patient called no response

## 2019-04-27 NOTE — ED Notes (Signed)
Patient called, no response.

## 2019-04-28 ENCOUNTER — Encounter: Payer: Self-pay | Admitting: Emergency Medicine

## 2019-04-28 ENCOUNTER — Other Ambulatory Visit: Payer: Self-pay

## 2019-04-28 ENCOUNTER — Emergency Department
Admission: EM | Admit: 2019-04-28 | Discharge: 2019-04-28 | Disposition: A | Discharge status: Home / Self Care | Payer: Medicaid Other | Attending: Emergency Medicine | Admitting: Emergency Medicine

## 2019-04-28 DIAGNOSIS — G43001 Migraine without aura, not intractable, with status migrainosus: Secondary | ICD-10-CM | POA: Diagnosis not present

## 2019-04-28 DIAGNOSIS — R519 Headache, unspecified: Secondary | ICD-10-CM | POA: Diagnosis present

## 2019-04-28 DIAGNOSIS — Z79899 Other long term (current) drug therapy: Secondary | ICD-10-CM | POA: Diagnosis not present

## 2019-04-28 DIAGNOSIS — G43901 Migraine, unspecified, not intractable, with status migrainosus: Secondary | ICD-10-CM | POA: Diagnosis not present

## 2019-04-28 MED ORDER — KETOROLAC TROMETHAMINE 30 MG/ML IJ SOLN
30.0000 mg | Freq: Once | INTRAMUSCULAR | Status: AC
  Administered 2019-04-28: 30 mg via INTRAMUSCULAR
  Filled 2019-04-28: qty 1

## 2019-04-28 MED ORDER — SUMATRIPTAN SUCCINATE 100 MG PO TABS: 100.0000 mg | ORAL_TABLET | ORAL | 3 refills | Status: DC | PRN

## 2019-04-28 MED ORDER — ONDANSETRON 4 MG PO TBDP
4.0000 mg | ORAL_TABLET | Freq: Once | ORAL | Status: AC
  Administered 2019-04-28: 4 mg via ORAL
  Filled 2019-04-28: qty 1

## 2019-04-28 NOTE — Discharge Instructions (Signed)
Drink plenty of water, take the Imitrex as needed.  Take Tylenol and ibuprofen at the beginning of a headache if this does not help use your Imitrex as prescribed.  Return emergency department worsening

## 2019-04-28 NOTE — ED Triage Notes (Signed)
Pt reports migraine HA since Friday. Pt reports no relief with OTC meds. Pt states that in high school she had to be on prescription medication for migraines but has not needed any since then.

## 2019-04-28 NOTE — ED Provider Notes (Signed)
[facility] Emergency Department Provider Note  ____________________________________________   First MD Initiated Contact with Patient 04/28/19 1255     (approximate)  I have reviewed the triage vital signs and the nursing notes.   HISTORY  Chief Complaint Migraine    HPI Samantha Andrews is a 24 y.o. female presents emergency department complaining of a headache since Friday.  No relief with over-the-counter medications but only took 1 or 2 ibuprofen and tried to lay down to sleep it off.  States she previously was on a headache medication but does not know which one.  No vomiting.  Some light sensitivity.  States the headache is similar to her regular headaches    Past Medical History:  Diagnosis Date  . Back pain   . Migraines     Patient Active Problem List   Diagnosis Date Noted  . Chlamydia 02/20/2018    Past Surgical History:  Procedure Laterality Date  . NO PAST SURGERIES      Prior to Admission medications   Medication Sig Start Date End Date Taking? Authorizing Provider  citalopram (CELEXA) 20 MG tablet Take 20 mg by mouth daily.    [provider]  misoprostol (CYTOTEC) 200 MCG tablet Place 2 tablets in each side of mouth between gum and cheek and allow to dissolve. May repeat in 3 days if no results. 02/17/18   Truett Mainland, DO  ondansetron (ZOFRAN ODT) 4 MG disintegrating tablet 4mg  ODT q4 hours prn nausea/vomit 03/19/15   Elnora Morrison, MD  SUMAtriptan (IMITREX) 100 MG tablet Take 1 tablet (100 mg total) by mouth every 2 (two) hours as needed for migraine. May repeat in 2 hours if headache persists or recurs. 04/28/19   Versie Starks, PA-C    Allergies Patient has no known allergies.  No family history on file.  Social History Social History   Tobacco Use  . Smoking status: Never Smoker  . Smokeless tobacco: Never Used  Substance Use Topics  . Alcohol use: No  . Drug use: Yes    Types: Marijuana     Review of Systems  Constitutional: No fever/chills Eyes: No visual changes. ENT: No sore throat. Respiratory: Denies cough Cardiovascular: Denies chest pain Gastrointestinal: Denies abdominal pain Genitourinary: Negative for dysuria. Musculoskeletal: Negative for back pain. Skin: Negative for rash. Psychiatric: no mood changes,     ____________________________________________   PHYSICAL EXAM:  VITAL SIGNS: ED Triage Vitals  Enc Vitals Group     BP 04/28/19 1207 (!) 144/73     Pulse Rate 04/28/19 1207 92     Resp 04/28/19 1207 20     Temp 04/28/19 1207 98.3 F (36.8 C)     Temp Source 04/28/19 1207 Oral     SpO2 04/28/19 1207 99 %     Weight 04/28/19 1204 175 lb (79.4 kg)     Height 04/28/19 1204 5\' 3"  (1.6 m)     Head Circumference --      Peak Flow --      Pain Score 04/28/19 1204 6     Pain Loc --      Pain Edu? --      Excl. in [place]? --     Constitutional: Alert and oriented. Well appearing and in no acute distress. Eyes: Conjunctivae are normal.  Head: Atraumatic. Nose: No congestion/rhinnorhea. Mouth/Throat: Mucous membranes are moist.   Neck:  supple no lymphadenopathy noted Cardiovascular: Normal rate, regular rhythm. Heart sounds are normal Respiratory: Normal respiratory effort.  No retractions, lungs c t a  Abd: soft nontender bs normal all 4 quad GU: deferred Musculoskeletal: FROM all extremities, warm and well perfused Neurologic:  Normal speech and language.  Skin:  Skin is warm, dry and intact. No rash noted. Psychiatric: Mood and affect are normal. Speech and behavior are normal.  ____________________________________________   LABS (all labs ordered are listed, but only abnormal results are displayed)  Labs Reviewed - No data to display ____________________________________________   ____________________________________________  RADIOLOGY    ____________________________________________   PROCEDURES  Procedure(s) performed:  No  Procedures    ____________________________________________   INITIAL IMPRESSION / ASSESSMENT AND PLAN / ED COURSE  Pertinent labs & imaging results that were available during my care of the patient were reviewed by me and considered in my medical decision making (see chart for details).   Patient is a 24 year old female presents emergency department with concerns of a migraine headache.  Similar to previous headaches.  Used to be on Imitrex.  See HPI  Physical exam patient appears well.  She is not in any distress at all.  Exam is basically unremarkable  I gave the patient Toradol 30 mg IM and Zofran ODT 4 mg.  She is to follow-up with her regular doctor.  Follow-up with neurology for chronic headaches.  She is given prescription for Imitrex.  Discharged in stable condition.  She states she understands will comply with her instructions.  She discharged stable condition.    Samantha Andrews was evaluated in Emergency Department on 04/28/2019 for the symptoms described in the history of present illness. She was evaluated in the context of the global COVID-19 pandemic, which necessitated consideration that the patient might be at risk for infection with the SARS-CoV-2 virus that causes COVID-19. Institutional protocols and algorithms that pertain to the evaluation of patients at risk for COVID-19 are in a state of rapid change based on information released by regulatory bodies including the CDC and federal and state organizations. These policies and algorithms were followed during the patient's care in the ED.   As part of my medical decision making, I reviewed the following data within the electronic MEDICAL RECORD NUMBER Nursing notes reviewed and incorporated, Old chart reviewed, Notes from prior ED visits and [place] Controlled Substance Database  ____________________________________________   FINAL CLINICAL IMPRESSION(S) / ED DIAGNOSES  Final diagnoses:  Migraine without aura and with  status migrainosus, not intractable      NEW MEDICATIONS STARTED DURING THIS VISIT:  Current Discharge Medication List       Note:  This document was prepared using Dragon voice recognition software and may include unintentional dictation errors.    Faythe Ghee, PA-C 04/28/19 1305    Sharman Cheek, MD 04/28/19 1447

## 2019-08-06 ENCOUNTER — Encounter (HOSPITAL_COMMUNITY): Payer: Self-pay

## 2019-08-06 ENCOUNTER — Inpatient Hospital Stay (HOSPITAL_COMMUNITY)
Admission: EM | Admit: 2019-08-06 | Discharge: 2019-08-06 | Disposition: A | Discharge status: Home / Self Care | Payer: Medicaid Other | Attending: Obstetrics and Gynecology | Admitting: Obstetrics and Gynecology

## 2019-08-06 ENCOUNTER — Other Ambulatory Visit: Payer: Self-pay

## 2019-08-06 ENCOUNTER — Inpatient Hospital Stay (HOSPITAL_COMMUNITY): Payer: Medicaid Other

## 2019-08-06 DIAGNOSIS — O26891 Other specified pregnancy related conditions, first trimester: Secondary | ICD-10-CM | POA: Diagnosis not present

## 2019-08-06 DIAGNOSIS — Z8759 Personal history of other complications of pregnancy, childbirth and the puerperium: Secondary | ICD-10-CM | POA: Diagnosis not present

## 2019-08-06 DIAGNOSIS — R109 Unspecified abdominal pain: Secondary | ICD-10-CM | POA: Diagnosis not present

## 2019-08-06 DIAGNOSIS — O09291 Supervision of pregnancy with other poor reproductive or obstetric history, first trimester: Secondary | ICD-10-CM | POA: Diagnosis not present

## 2019-08-06 DIAGNOSIS — O3680X0 Pregnancy with inconclusive fetal viability, not applicable or unspecified: Secondary | ICD-10-CM | POA: Diagnosis not present

## 2019-08-06 DIAGNOSIS — Z79899 Other long term (current) drug therapy: Secondary | ICD-10-CM | POA: Diagnosis not present

## 2019-08-06 DIAGNOSIS — O209 Hemorrhage in early pregnancy, unspecified: Secondary | ICD-10-CM | POA: Insufficient documentation

## 2019-08-06 DIAGNOSIS — Z3A01 Less than 8 weeks gestation of pregnancy: Secondary | ICD-10-CM | POA: Insufficient documentation

## 2019-08-06 DIAGNOSIS — Z3A00 Weeks of gestation of pregnancy not specified: Secondary | ICD-10-CM | POA: Diagnosis not present

## 2019-08-06 LAB — CBC
HCT: 39.5 % (ref 36.0–46.0)
Hemoglobin: 12 g/dL (ref 12.0–15.0)
MCH: 26.3 pg (ref 26.0–34.0)
MCHC: 30.4 g/dL (ref 30.0–36.0)
MCV: 86.6 fL (ref 80.0–100.0)
Platelets: 331 10*3/uL (ref 150–400)
RBC: 4.56 MIL/uL (ref 3.87–5.11)
RDW: 13 % (ref 11.5–15.5)
WBC: 8.9 10*3/uL (ref 4.0–10.5)
nRBC: 0 % (ref 0.0–0.2)

## 2019-08-06 LAB — URINALYSIS, ROUTINE W REFLEX MICROSCOPIC
Bacteria, UA: NONE SEEN
Bilirubin Urine: NEGATIVE
Glucose, UA: NEGATIVE mg/dL
Ketones, ur: NEGATIVE mg/dL
Leukocytes,Ua: NEGATIVE
Nitrite: NEGATIVE
Protein, ur: NEGATIVE mg/dL
Specific Gravity, Urine: 1.021 (ref 1.005–1.030)
pH: 6 (ref 5.0–8.0)

## 2019-08-06 LAB — I-STAT BETA HCG BLOOD, ED (MC, WL, AP ONLY): I-stat hCG, quantitative: 106.9 m[IU]/mL — ABNORMAL HIGH (ref <5)

## 2019-08-06 LAB — WET PREP, GENITAL
Sperm: NONE SEEN
Trich, Wet Prep: NONE SEEN
Yeast Wet Prep HPF POC: NONE SEEN

## 2019-08-06 LAB — HIV ANTIBODY (ROUTINE TESTING W REFLEX): HIV Screen 4th Generation wRfx: NONREACTIVE

## 2019-08-06 LAB — HCG, QUANTITATIVE, PREGNANCY: hCG, Beta Chain, Quant, S: 106 m[IU]/mL — ABNORMAL HIGH (ref <5)

## 2019-08-06 NOTE — MAU Provider Note (Addendum)
Chief Complaint: Vaginal Bleeding   None     SUBJECTIVE HPI: Samantha Andrews is a 24 y.o. G3P0020 at [redacted]w[redacted]d by LMP who presents to maternity admissions reporting 4 days of RLQ cramping and vaginal bleeding. She reports that her period started on 7/18 and she has been bleeding since then, which is 4 days longer than her usual period--though the last 4 days have been very light bleeding. In 2020, patient reports having had 2 miscarriages (in Feb and July) at about 6 weeks of pregnancy.  She denies vaginal itching/burning, urinary symptoms, h/a, dizziness, n/v, or fever/chills.     Location: RLQ Quality: constant "heaviness" Severity: 4-5/10 on pain scale Duration: 4 days Timing: Constant Modifying factors: worse with resting, improved with movement  Associated signs and symptoms: vaginal bleeding  Past Medical History:  Diagnosis Date  . Back pain   . Migraines    Past Surgical History:  Procedure Laterality Date  . NO PAST SURGERIES     Social History   Socioeconomic History  . Marital status: Single    Spouse name: Not on file  . Number of children: Not on file  . Years of education: Not on file  . Highest education level: Not on file  Occupational History  . Not on file  Tobacco Use  . Smoking status: Never Smoker  . Smokeless tobacco: Never Used  Substance and Sexual Activity  . Alcohol use: No  . Drug use: Yes    Types: Marijuana  . Sexual activity: Yes    Birth control/protection: None  Other Topics Concern  . Not on file  Social History Narrative  . Not on file   Social Determinants of Health   Financial Resource Strain:   . Difficulty of Paying Living Expenses:   Food Insecurity:   . Worried About Programme researcher, broadcasting/film/video in the Last Year:   . Barista in the Last Year:   Transportation Needs:   . Freight forwarder (Medical):   Marland Kitchen Lack of Transportation (Non-Medical):   Physical Activity:   . Days of Exercise per Week:   . Minutes of  Exercise per Session:   Stress:   . Feeling of Stress :   Social Connections:   . Frequency of Communication with Friends and Family:   . Frequency of Social Gatherings with Friends and Family:   . Attends Religious Services:   . Active Member of Clubs or Organizations:   . Attends Banker Meetings:   Marland Kitchen Marital Status:   Intimate Partner Violence:   . Fear of Current or Ex-Partner:   . Emotionally Abused:   Marland Kitchen Physically Abused:   . Sexually Abused:    No current facility-administered medications on file prior to encounter.   Current Outpatient Medications on File Prior to Encounter  Medication Sig Dispense Refill  . citalopram (CELEXA) 20 MG tablet Take 20 mg by mouth daily.    . misoprostol (CYTOTEC) 200 MCG tablet Place 2 tablets in each side of mouth between gum and cheek and allow to dissolve. May repeat in 3 days if no results. 4 tablet 1  . ondansetron (ZOFRAN ODT) 4 MG disintegrating tablet 4mg  ODT q4 hours prn nausea/vomit 4 tablet 0  . SUMAtriptan (IMITREX) 100 MG tablet Take 1 tablet (100 mg total) by mouth every 2 (two) hours as needed for migraine. May repeat in 2 hours if headache persists or recurs. 10 tablet 3   No Known Allergies  ROS:  Review  of Systems  Eyes: Negative for visual disturbance.  Respiratory: Negative for chest tightness and shortness of breath.   Cardiovascular: Negative for chest pain.  Gastrointestinal: Positive for abdominal pain. Negative for constipation, diarrhea, nausea and vomiting.  Genitourinary: Positive for pelvic pain and vaginal bleeding. Negative for frequency, urgency, vaginal discharge and vaginal pain.  Neurological: Negative for dizziness, light-headedness and headaches.     I have reviewed patient's Past Medical Hx, Surgical Hx, Family Hx, Social Hx, medications and allergies.   Physical Exam   Patient Vitals for the past 24 hrs:  BP Temp Temp src Pulse Resp SpO2 Height Weight  08/06/19 1721 (!) 133/76 98.6  F (37 C) Oral 84 17 100 % 5\' 3"  (1.6 m) (!) 96 kg  08/06/19 1419 (!) 165/110 99.1 F (37.3 C) Oral 100 16 96 % 5\' 3"  (1.6 m) 81.6 kg   Constitutional: Well-developed, well-nourished female in no acute distress.  Cardiovascular: normal rate Respiratory: normal effort GI: Abd soft, non-tender. Pos BS x 4 MS: Extremities nontender, no edema, normal ROM Neurologic: Alert and oriented x 4.    LAB RESULTS Results for orders placed or performed during the hospital encounter of 08/06/19 (from the past 24 hour(s))  CBC     Status: None   Collection Time: 08/06/19  2:30 PM  Result Value Ref Range   WBC 8.9 4.0 - 10.5 K/uL   RBC 4.56 3.87 - 5.11 MIL/uL   Hemoglobin 12.0 12.0 - 15.0 g/dL   HCT 08/08/19 36 - 46 %   MCV 86.6 80.0 - 100.0 fL   MCH 26.3 26.0 - 34.0 pg   MCHC 30.4 30.0 - 36.0 g/dL   RDW 08/08/19 96.7 - 89.3 %   Platelets 331 150 - 400 K/uL   nRBC 0.0 0.0 - 0.2 %  I-Stat beta hCG blood, ED     Status: Abnormal   Collection Time: 08/06/19  2:45 PM  Result Value Ref Range   I-stat hCG, quantitative 106.9 (H) <5 mIU/mL   Comment 3          Urinalysis, Routine w reflex microscopic     Status: Abnormal   Collection Time: 08/06/19  5:15 PM  Result Value Ref Range   Color, Urine YELLOW YELLOW   APPearance CLEAR CLEAR   Specific Gravity, Urine 1.021 1.005 - 1.030   pH 6.0 5.0 - 8.0   Glucose, UA NEGATIVE NEGATIVE mg/dL   Hgb urine dipstick SMALL (A) NEGATIVE   Bilirubin Urine NEGATIVE NEGATIVE   Ketones, ur NEGATIVE NEGATIVE mg/dL   Protein, ur NEGATIVE NEGATIVE mg/dL   Nitrite NEGATIVE NEGATIVE   Leukocytes,Ua NEGATIVE NEGATIVE   Bacteria, UA NONE SEEN NONE SEEN   Squamous Epithelial / LPF 0-5 0 - 5   Mucus PRESENT        IMAGING No results found.  MAU Management/MDM: Orders Placed This Encounter  Procedures  . Wet prep, genital  . 08/08/19 OB LESS THAN 14 WEEKS WITH OB TRANSVAGINAL  . CBC  . Urinalysis, Routine w reflex microscopic  . hCG, quantitative, pregnancy  . HIV  Antibody (routine testing w rflx)  . Diet NPO time specified  . Pelvic cart to bedside  . I-Stat beta hCG blood, ED    No orders of the defined types were placed in this encounter.   Treatments in MAU included Ectopic workup. Pt discharged with strict  precautions.  ASSESSMENT  1. Abdominal pain during pregnancy in first trimester     PLAN  Evelena Leyden, DO PGY 1 Family Medicine 08/06/2019  7:41 PM   US OB LESS THAN 14 WEEKS WITH OB TRANSVAGINAL  Result Date: 08/06/2019 CLINICAL DATA:  24 year old female with pain and bleeding. Quantitative beta HCG 106.9. EXAM: OBSTETRIC <14 WK [place] AND TRANSVAGINAL OB US TECHNIQUE: Both transabdominal and transvaginal ultrasound examinations were performed for complete evaluation of the gestation as well as the maternal uterus, adnexal regions, and pelvic cul-de-sac. Transvaginal technique was performed to assess early pregnancy. COMPARISON:  None relevant. FINDINGS: Intrauterine gestational sac: None identified. The endometrium appears bland measuring 7 mm in thickness (image 38). Maternal uterus/adnexae: No pelvic free fluid. The right ovary appears normal measuring 4.3 x 2.6 x 2.5 cm. The left ovary appears normal measuring 3.6 x 2.1 x 2.4 cm. No abnormality of the uterus. IMPRESSION: No intrauterine pregnancy identified, and no abnormality of the uterus or ovaries detected. Differential considerations include early IUP, failed IUP, and occult ectopic pregnancy. Recommend serial quantitative beta HCG and repeat ultrasound as necessary. Electronically Signed   By: Odessa Fleming M.D.   On: 08/06/2019 20:58    Results for orders placed or performed during the hospital encounter of 08/06/19 (from the past 24 hour(s))  CBC     Status: None   Collection Time: 08/06/19  2:30 PM  Result Value Ref Range   WBC 8.9 4.0 - 10.5 K/uL   RBC 4.56 3.87 - 5.11 MIL/uL   Hemoglobin 12.0 12.0 - 15.0 g/dL   HCT 87.5 36 - 46 %   MCV 86.6 80.0 - 100.0 fL   MCH 26.3 26.0  - 34.0 pg   MCHC 30.4 30.0 - 36.0 g/dL   RDW 64.3 32.9 - 51.8 %   Platelets 331 150 - 400 K/uL   nRBC 0.0 0.0 - 0.2 %  I-Stat beta hCG blood, ED     Status: Abnormal   Collection Time: 08/06/19  2:45 PM  Result Value Ref Range   I-stat hCG, quantitative 106.9 (H) <5 mIU/mL   Comment 3          Urinalysis, Routine w reflex microscopic     Status: Abnormal   Collection Time: 08/06/19  5:15 PM  Result Value Ref Range   Color, Urine YELLOW YELLOW   APPearance CLEAR CLEAR   Specific Gravity, Urine 1.021 1.005 - 1.030   pH 6.0 5.0 - 8.0   Glucose, UA NEGATIVE NEGATIVE mg/dL   Hgb urine dipstick SMALL (A) NEGATIVE   Bilirubin Urine NEGATIVE NEGATIVE   Ketones, ur NEGATIVE NEGATIVE mg/dL   Protein, ur NEGATIVE NEGATIVE mg/dL   Nitrite NEGATIVE NEGATIVE   Leukocytes,Ua NEGATIVE NEGATIVE   Bacteria, UA NONE SEEN NONE SEEN   Squamous Epithelial / LPF 0-5 0 - 5   Mucus PRESENT   hCG, quantitative, pregnancy     Status: Abnormal   Collection Time: 08/06/19  7:18 PM  Result Value Ref Range   hCG, Beta Chain, Quant, S 106 (H) <5 mIU/mL  HIV Antibody (routine testing w rflx)     Status: None   Collection Time: 08/06/19  7:18 PM  Result Value Ref Range   HIV Screen 4th Generation wRfx Non Reactive Non Reactive  Wet prep, genital     Status: Abnormal   Collection Time: 08/06/19  8:05 PM   Specimen: PATH Cytology Cervicovaginal Ancillary Only  Result Value Ref Range   Yeast Wet Prep HPF POC NONE SEEN NONE SEEN   Trich, Wet Prep NONE SEEN NONE SEEN   Clue  Cells Wet Prep HPF POC PRESENT (A) NONE SEEN   WBC, Wet Prep HPF POC MANY (A) NONE SEEN   Sperm NONE SEEN    MDM:   Findings today could represent a normal early pregnancy, spontaneous abortion or ectopic pregnancy which can be life-threatening.  Ectopic precautions were given to the patient with plan to return in 48 hours for repeat quant hcg to evaluate pregnancy development.  Clue cells on wet prep but pt denies any other symptoms c/w  BV.    A: 1. Pregnancy of unknown anatomic location   2. Abdominal pain during pregnancy in first trimester     P: Return to MAU in 48 hours on Saturday for repeat hcg Return sooner to MAU for emergencies  Sharen CounterLisa Leftwich-Kirby, CNM 10:30 PM

## 2019-08-06 NOTE — Progress Notes (Signed)
Written and verbal d/c instructions given and understanding voiced. 

## 2019-08-06 NOTE — MAU Note (Signed)
Pt states she has been having abdominal cramping for the last 4 days and has had some bleeding that is light but not currently. Reports last period as 07/26/2019 but bled for a few days after her usual 7.

## 2019-08-06 NOTE — ED Triage Notes (Signed)
Patient presents to the ED with c/o vaginal bleeding for 4 days. Denies saturating an entire pad per day. Denies dizziness, lightheadedness, fever, chills. Patient appears in no acute distress, respirs even and unlabored. Skin warm and dry. AAOx3.

## 2019-08-06 NOTE — ED Provider Notes (Signed)
24 year old G3P0020 who presents with right sided pelvic pain and vaginal spotting. She states the pain started today. It feels like right sided cramping. Her LMP was 7/18 and she has taken multiple pregnancy tests at home which were negative. Her period lasted much longer than normal - about 11 days.   Vitals are stable  On exam she is alert, oriented, NAD.  I-stat bhcg is 106   Discussed with CNM at MAU who accepts pt in transfer   Bethel Born, PA-C 08/06/19 1634    Pricilla Loveless, MD 08/08/19 [phone]

## 2019-08-07 LAB — GC/CHLAMYDIA PROBE AMP (~~LOC~~) NOT AT ARMC
Chlamydia: NEGATIVE
Comment: NEGATIVE
Comment: NORMAL
Neisseria Gonorrhea: NEGATIVE

## 2019-08-08 ENCOUNTER — Inpatient Hospital Stay (HOSPITAL_COMMUNITY)
Admission: AD | Admit: 2019-08-08 | Discharge: 2019-08-08 | Disposition: A | Discharge status: Home / Self Care | Payer: Medicaid Other | Attending: Obstetrics and Gynecology | Admitting: Obstetrics and Gynecology

## 2019-08-08 ENCOUNTER — Other Ambulatory Visit: Payer: Self-pay

## 2019-08-08 DIAGNOSIS — O039 Complete or unspecified spontaneous abortion without complication: Secondary | ICD-10-CM | POA: Insufficient documentation

## 2019-08-08 LAB — HCG, QUANTITATIVE, PREGNANCY: hCG, Beta Chain, Quant, S: 70 m[IU]/mL — ABNORMAL HIGH (ref <5)

## 2019-08-08 NOTE — MAU Provider Note (Signed)
None     S Ms. Chasya Zenz is a 24 y.o. G9P0020 female who presents to MAU today for repeat hCG testing.  Patient reports bleeding is scant and with no pain.  Patient states she does desire conception.   O BP (!) 134/81 (BP Location: Right Arm)   Pulse 80   Temp 98.6 F (37 C) (Oral)   Resp 17   LMP 07/26/2019   SpO2 100%   Results for orders placed or performed during the hospital encounter of 08/08/19 (from the past 24 hour(s))  hCG, quantitative, pregnancy     Status: Abnormal   Collection Time: 08/08/19  8:55 PM  Result Value Ref Range   hCG, Beta Chain, Quant, S 70 (H) <5 mIU/mL    Physical Exam Vitals reviewed.  Constitutional:      Appearance: Normal appearance.  HENT:     Head: Normocephalic and atraumatic.  Eyes:     Conjunctiva/sclera: Conjunctivae normal.  Pulmonary:     Effort: Pulmonary effort is normal. No respiratory distress.  Musculoskeletal:        General: Normal range of motion.  Neurological:     Mental Status: She is alert and oriented to person, place, and time.  Psychiatric:        Mood and Affect: Mood normal.        Behavior: Behavior normal.        Thought Content: Thought content normal.     A SAB x 3 Medical screening exam complete Desires Conception  P Reviewed decreasing hCG. Informed that results definitive for SAB. Discussed need for follow up in 2 weeks for repeat labs. Discussed consult for infertility and preconception counseling. Patient without questions or concerns. Tentative plan to follow up at [facility]. Message sent to office. Discharge from MAU in stable condition  Gerrit Heck, [place] 08/08/2019 10:10 PM

## 2019-08-08 NOTE — Discharge Instructions (Signed)

## 2019-08-08 NOTE — MAU Note (Signed)
Pt presents for follow up BHCG, reports only mild discomfort, lite bleeding.

## 2019-08-20 ENCOUNTER — Ambulatory Visit: Payer: Medicaid Other | Admitting: Obstetrics and Gynecology

## 2019-08-26 ENCOUNTER — Ambulatory Visit: Payer: Medicaid Other | Admitting: Certified Nurse Midwife

## 2020-10-04 IMAGING — US OBSTETRIC <14 WK ULTRASOUND
1 series · 14 of 28 positions shown · non-contrast
Comparison: 02/15/2018

CLINICAL DATA: Vaginal bleeding

EXAM:
OBSTETRIC <14 WK US AND TRANSVAGINAL OB US
TECHNIQUE: Both transabdominal and transvaginal ultrasound examinations were
performed for complete evaluation of the gestation as well as the
maternal uterus, adnexal regions, and pelvic cul-de-sac.
Transvaginal technique was performed to assess early pregnancy.

[Series 1: obstetric <14 wk ultrasound · 0.20mm/px · 14 of 69 slices shown]
[im 3/69]
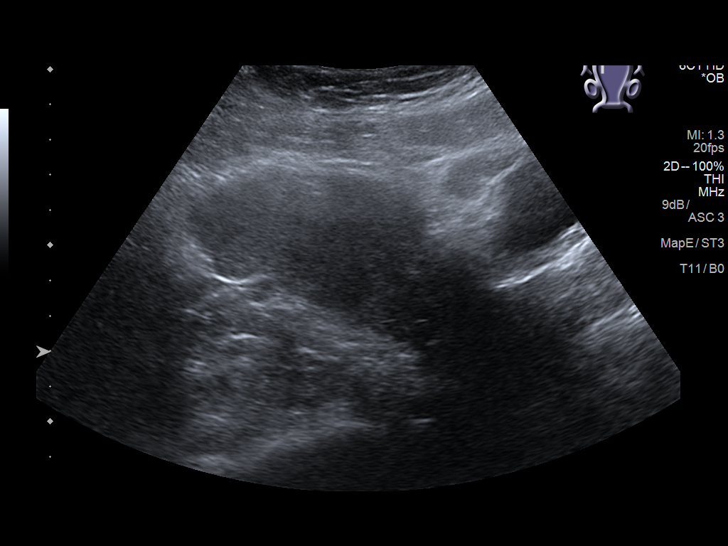
[im 8/69]
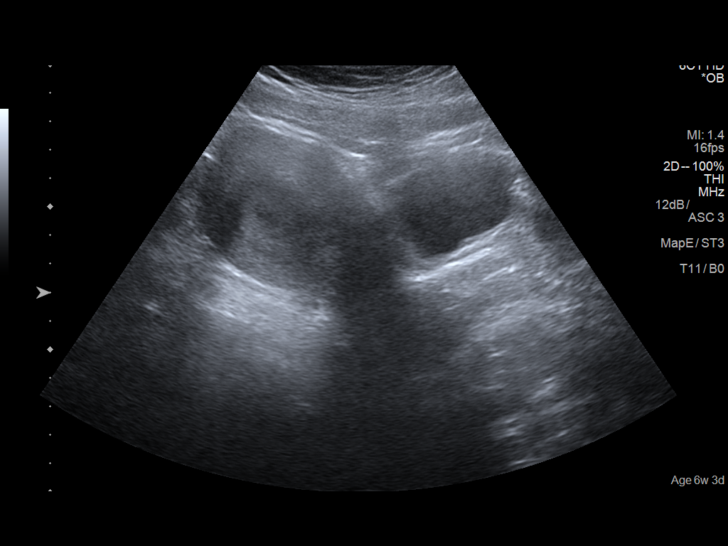
[im 13/69]
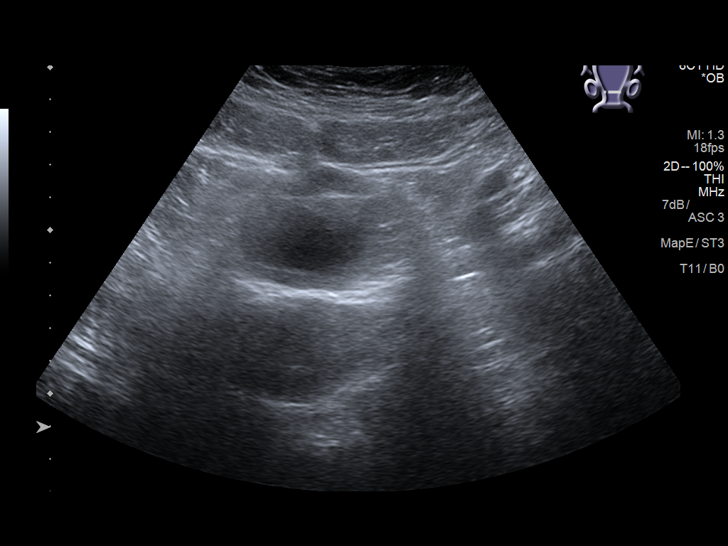
[im 18/69]
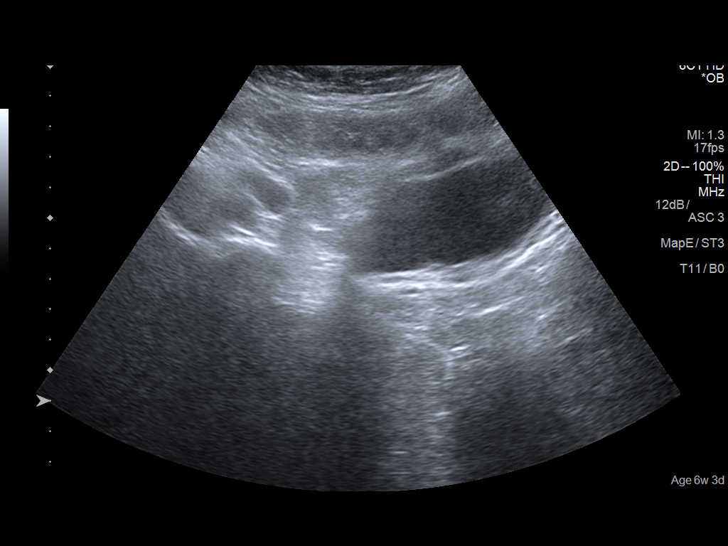
[im 23/69]
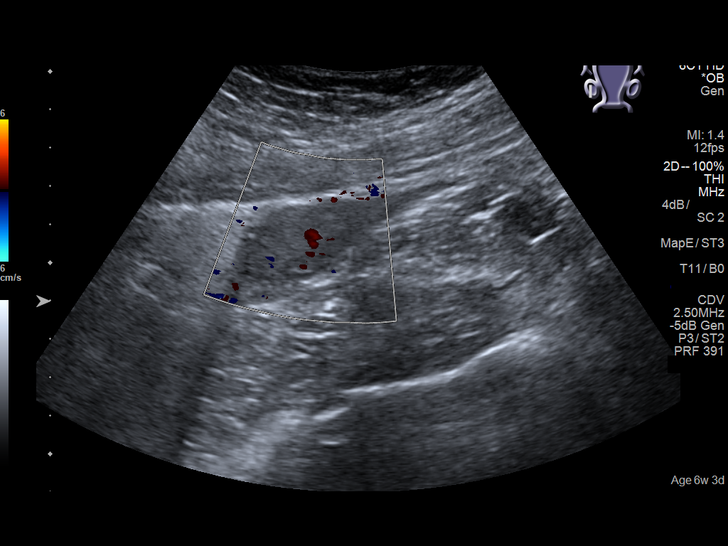
[im 28/69]
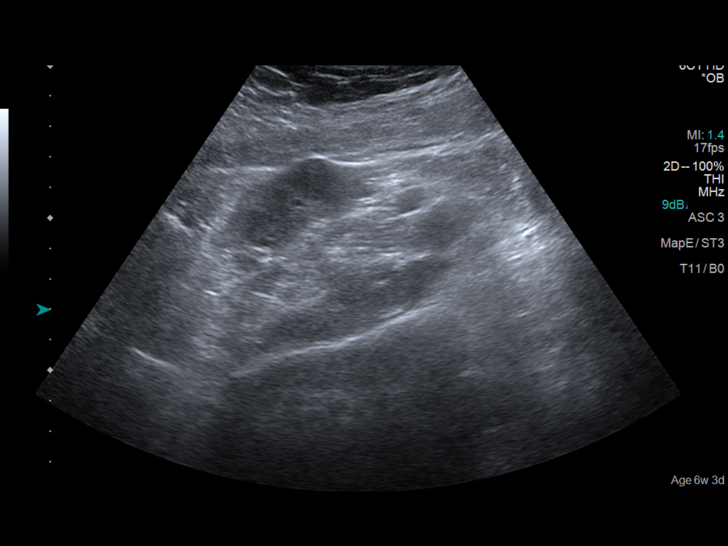
[im 33/69]
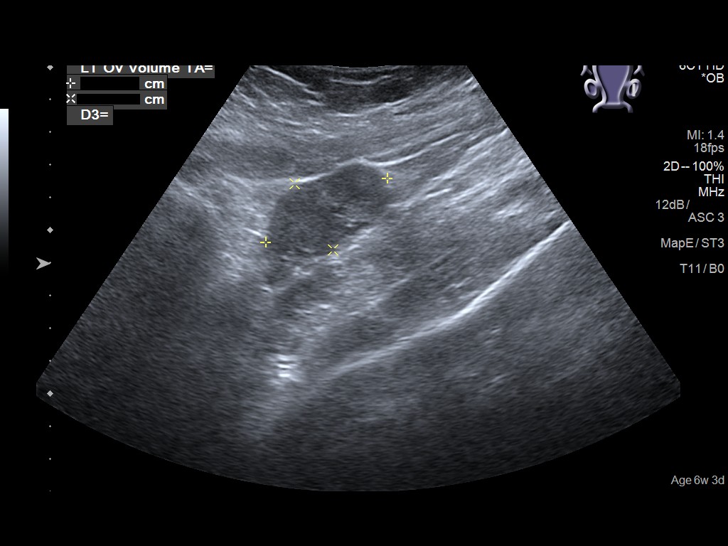
[im 38/69]
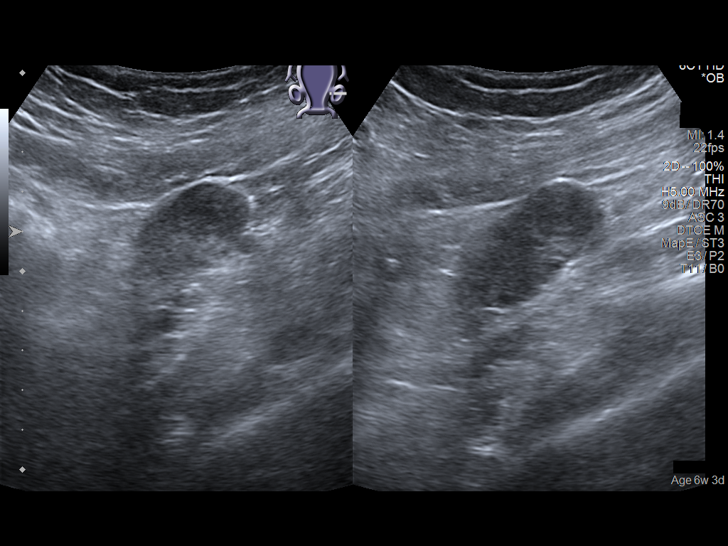
[im 43/69]
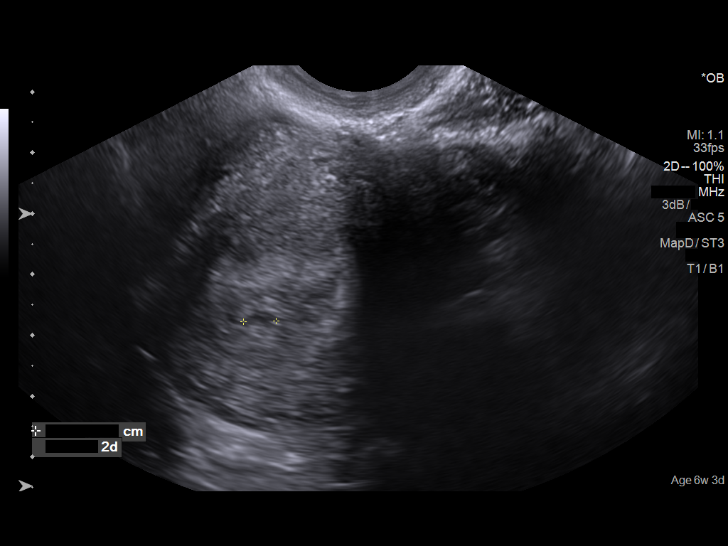
[im 48/69]
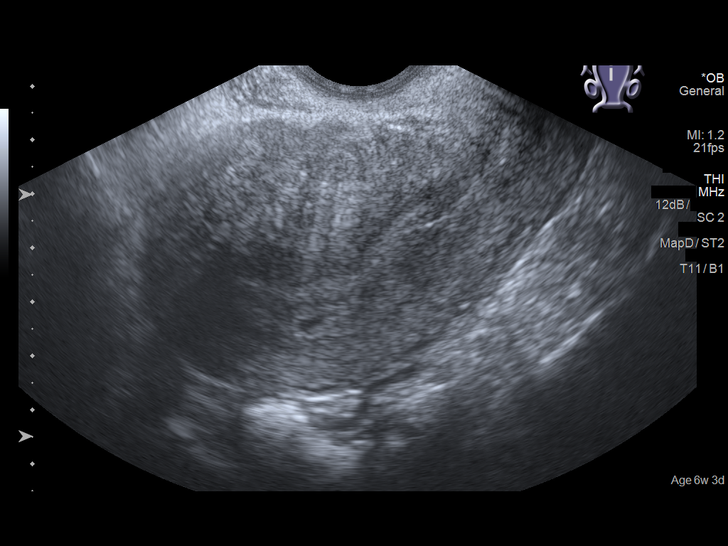
[im 53/69]
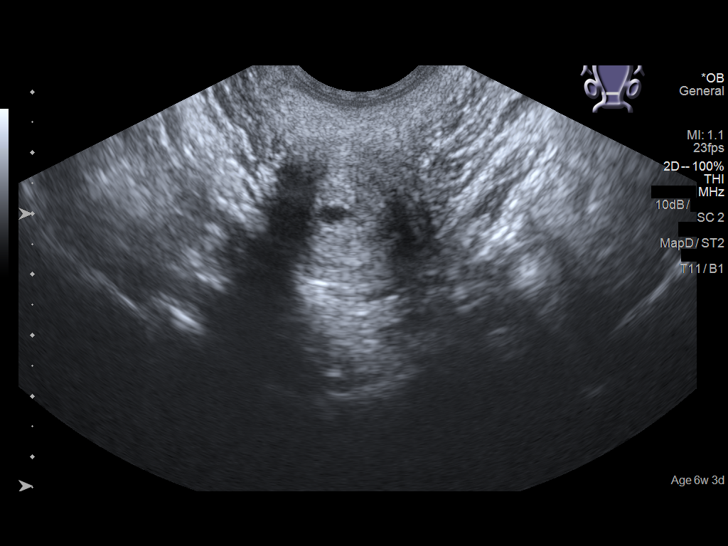
[im 58/69]
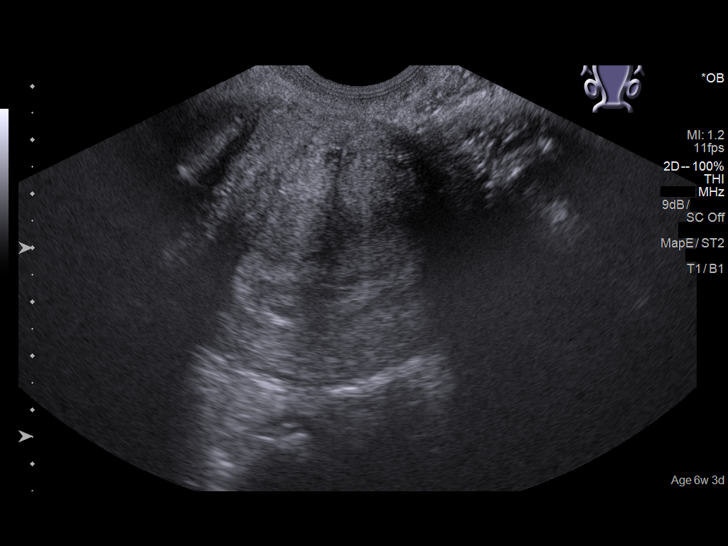
[im 63/69]
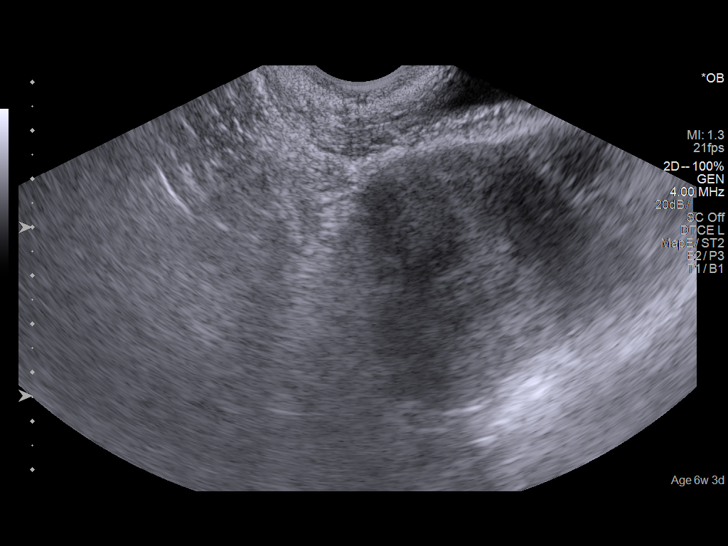
[im 69/69]
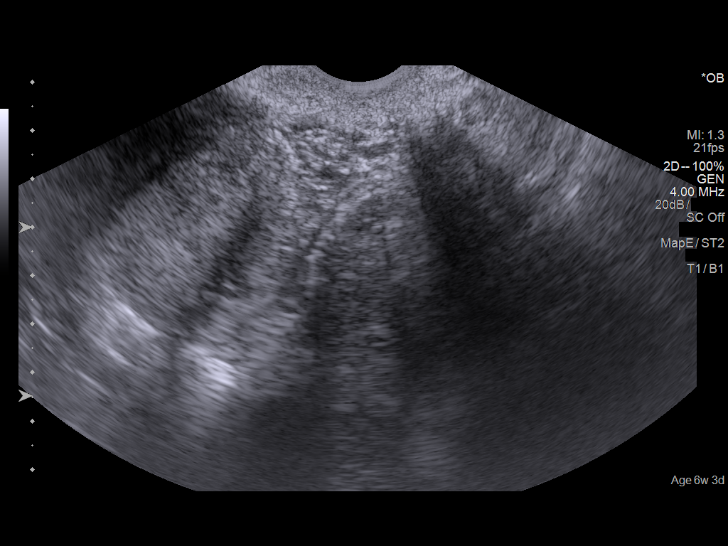

[14 of 28 positions shown; findings below may reference images not displayed]

FINDINGS: Intrauterine gestational sac: Single

Yolk sac:  Not Visualized.

Embryo:  Not Visualized.

Cardiac Activity: Not Visualized.

MSD: 5.1 mm   5 w   2 d

Subchorionic hemorrhage:  None visualized.

Maternal uterus/adnexae: There is no acute maternal abnormality.
IMPRESSION: Probable early intrauterine gestational sac, but no yolk sac, fetal
pole, or cardiac activity yet visualized. Recommend follow-up
quantitative B-HCG levels and follow-up US in 14 days to assess
viability. This recommendation follows SRU consensus guidelines:
Diagnostic Criteria for Nonviable Pregnancy Early in the First
Trimester. N Engl J Med 0138; [DATE].

## 2021-10-11 IMAGING — US US OB < 14 WEEKS - US OB TV
1 series · 15 of 28 positions shown · non-contrast
Comparison: None relevant.

CLINICAL DATA: 24-year-old female with pain and bleeding.
Quantitative beta HCG 106.9.

EXAM:
OBSTETRIC <14 WK US AND TRANSVAGINAL OB US
TECHNIQUE: Both transabdominal and transvaginal ultrasound examinations were
performed for complete evaluation of the gestation as well as the
maternal uterus, adnexal regions, and pelvic cul-de-sac.
Transvaginal technique was performed to assess early pregnancy.

[Series 1: us ob < 14 weeks - us ob tv · 15 of 59 slices shown]
[im 1/59]
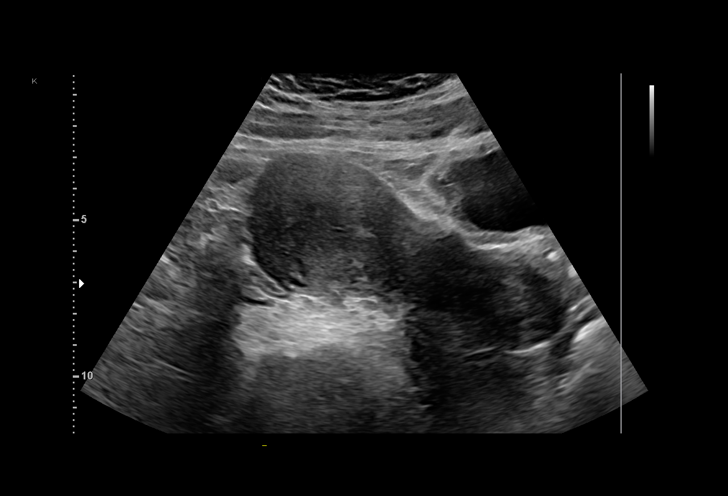
[im 5/59]
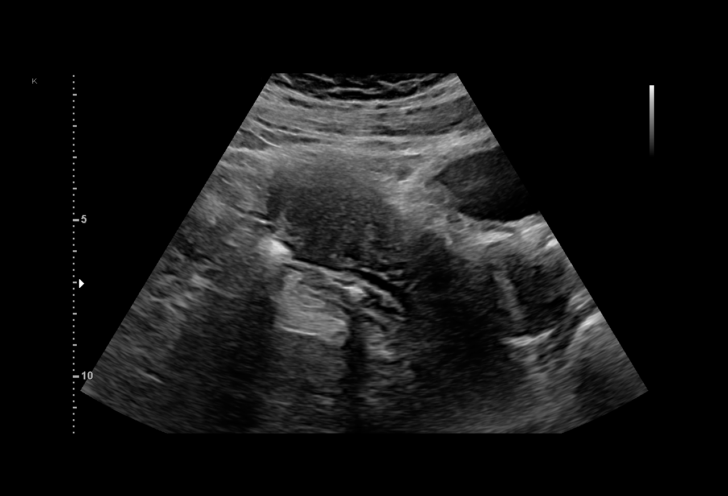
[im 9/59]
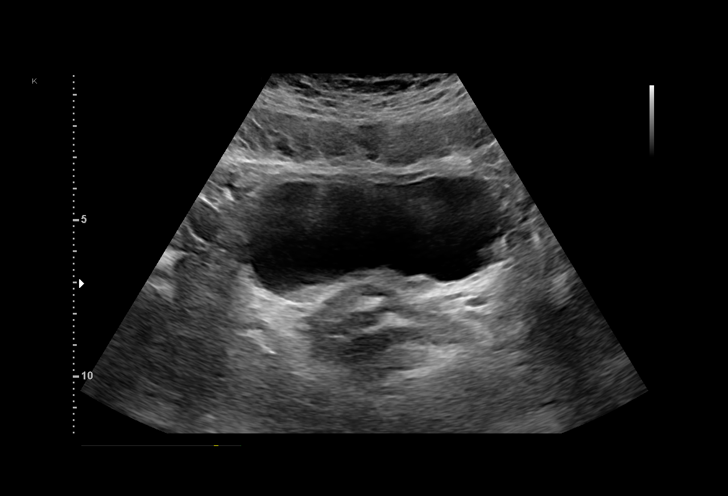
[im 13/59]
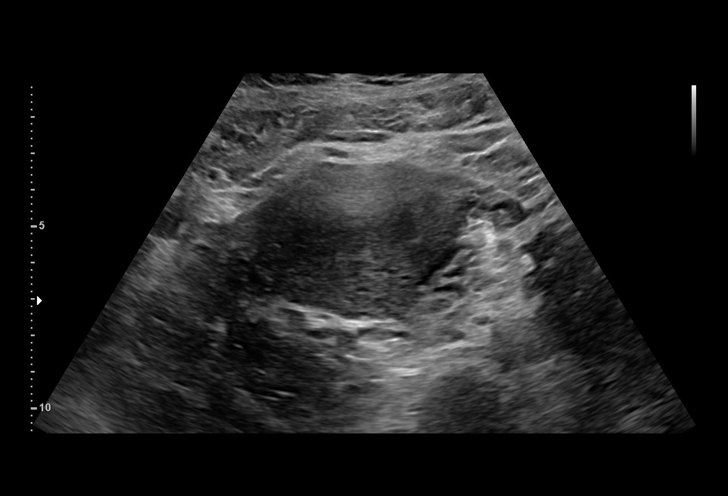
[im 18/59]
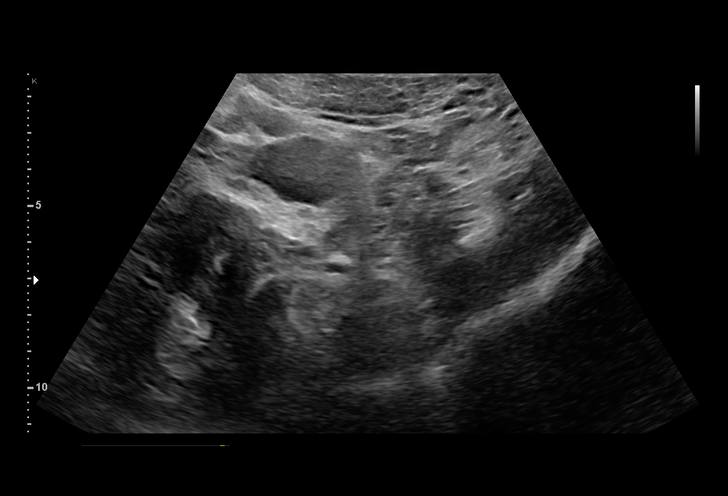
[im 22/59]
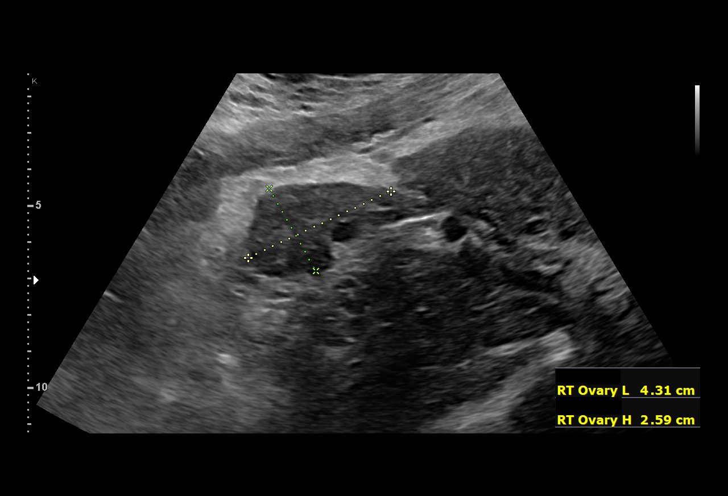
[im 26/59]
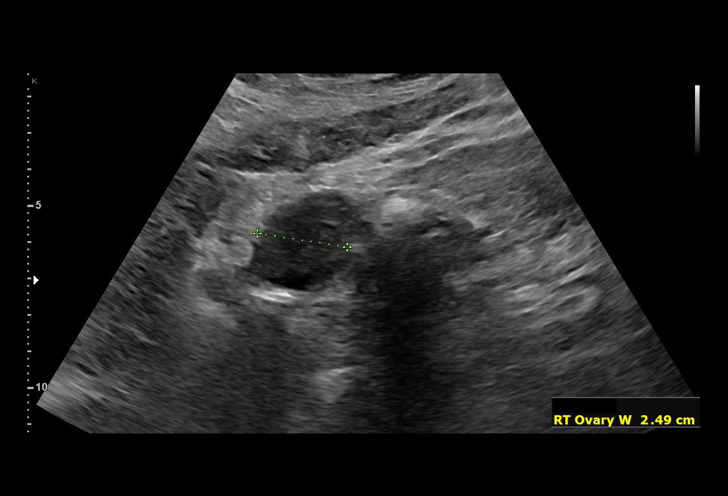
[im 31/59]
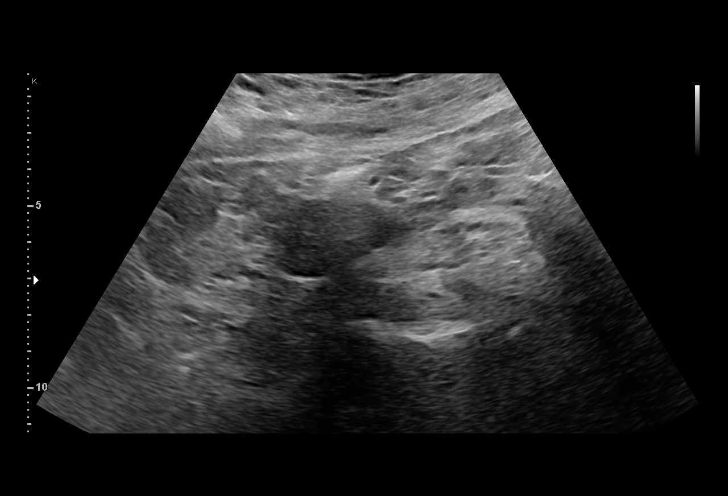
[im 33/59]
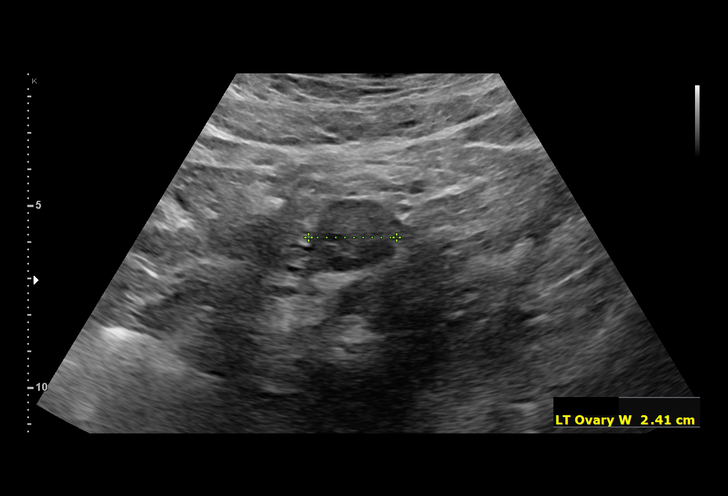
[im 37/59]
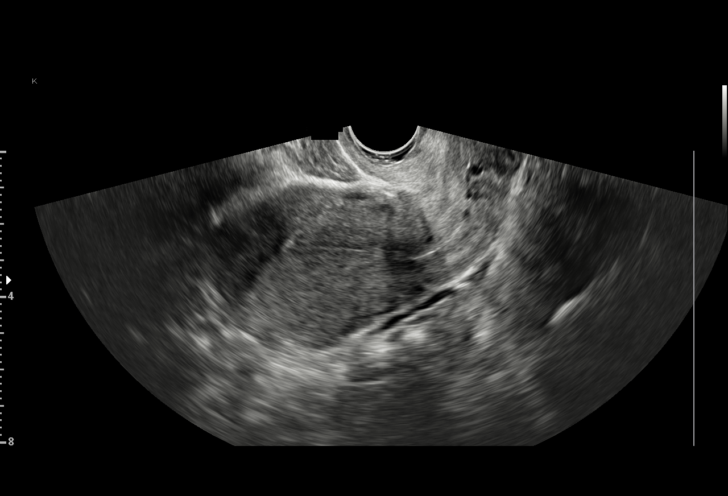
[im 41/59]
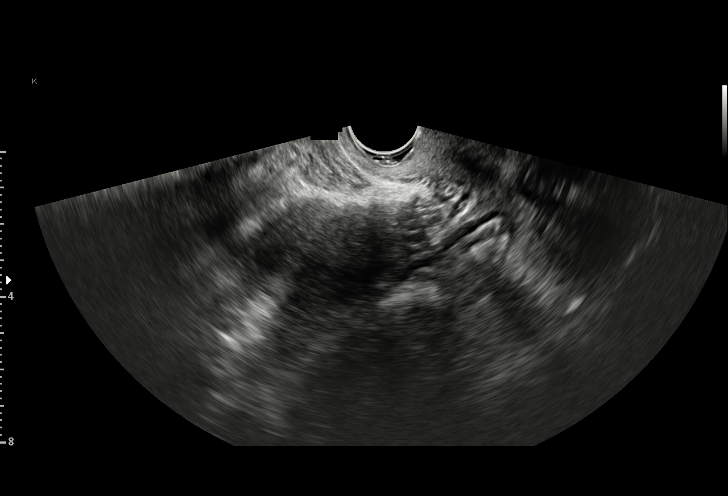
[im 46/59]
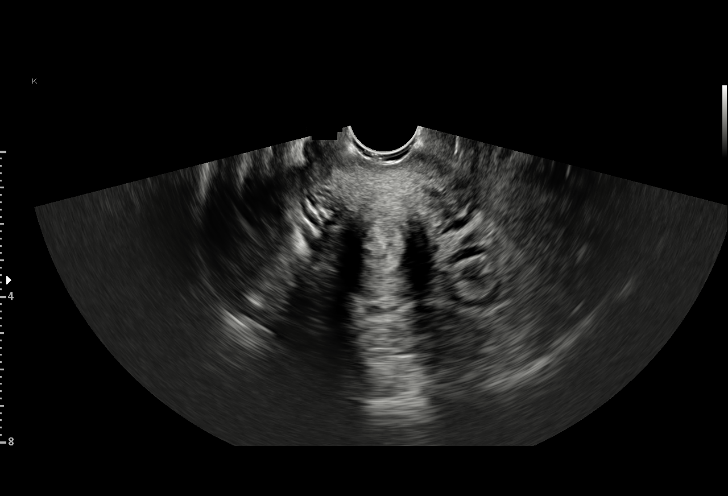
[im 50/59]
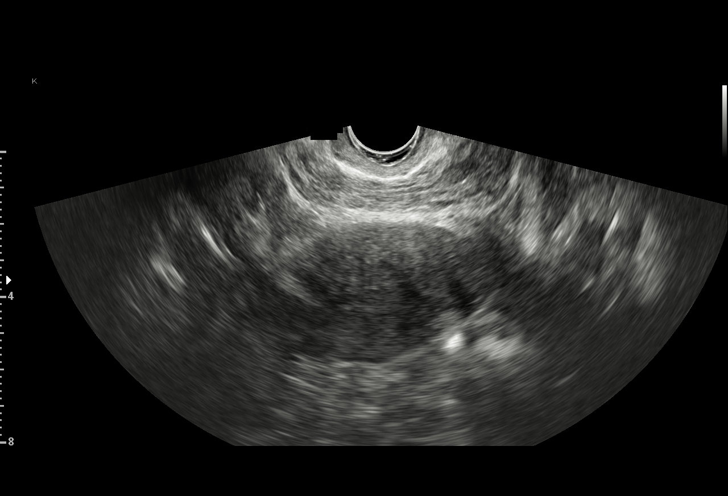
[im 54/59]
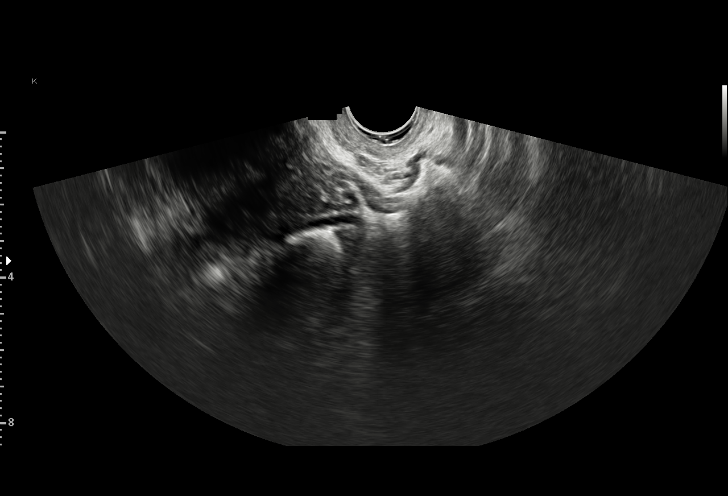
[im 59/59]
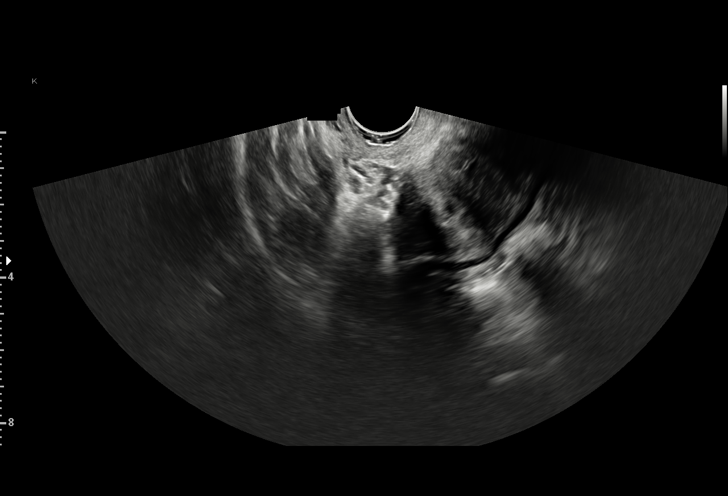

[15 of 28 positions shown; findings below may reference images not displayed]

FINDINGS: Intrauterine gestational sac: None identified.

The endometrium appears bland measuring 7 mm in thickness (image
38).

Maternal uterus/adnexae: No pelvic free fluid. The right ovary
appears normal measuring 4.3 x 2.6 x 2.5 cm. The left ovary appears
normal measuring 3.6 x 2.1 x 2.4 cm. No abnormality of the uterus.
IMPRESSION: No intrauterine pregnancy identified, and no abnormality of the
uterus or ovaries detected.

Differential considerations include early IUP, failed IUP, and
occult ectopic pregnancy. Recommend serial quantitative beta HCG and
repeat ultrasound as necessary.

## 2022-07-15 ENCOUNTER — Other Ambulatory Visit: Payer: Self-pay

## 2022-07-15 ENCOUNTER — Emergency Department (HOSPITAL_COMMUNITY)
Admission: EM | Admit: 2022-07-15 | Discharge: 2022-07-15 | Disposition: A | Discharge status: Home / Self Care | Payer: Medicaid Other | Attending: Emergency Medicine | Admitting: Emergency Medicine

## 2022-07-15 ENCOUNTER — Emergency Department (HOSPITAL_COMMUNITY): Payer: Medicaid Other

## 2022-07-15 DIAGNOSIS — M25511 Pain in right shoulder: Secondary | ICD-10-CM | POA: Diagnosis not present

## 2022-07-15 MED ORDER — METHYLPREDNISOLONE 4 MG PO TBPK: ORAL_TABLET | ORAL | 0 refills | Status: DC

## 2022-07-15 NOTE — Discharge Instructions (Addendum)
You are seen emergency department today for shoulder pain.  As we discussed your x-ray showed no broken or dislocated bones.  I suspect that your pain is likely related to an overuse injury.  You can continue taking over-the-counter medications like ibuprofen and/or Tylenol. I am writing you a prescription for some steroids as well.  I recommend following up with the orthopedist if your symptoms don't get any better.   Continue to monitor how you're doing and return to the ER for new or worsening symptoms.

## 2022-07-15 NOTE — ED Provider Notes (Signed)
[place] EMERGENCY DEPARTMENT AT [facility] Provider Note   CSN: [id number] Arrival date & time: 07/15/22  1404     History  Chief Complaint  Patient presents with   Shoulder Pain    Right    Samantha Andrews is a 27 y.o. female with history of back pain and migraines who presents emergency department complaining of right shoulder pain for the past 3 weeks.  Patient states that this got significantly worse in the past week.  She has been trying over-the-counter medications without relief.  She is right-hand dominant.  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    Shoulder Pain      Home Medications Prior to Admission medications   Medication Sig Start Date End Date Taking? Authorizing Provider  methylPREDNISolone (MEDROL DOSEPAK) 4 MG TBPK tablet Take per package instructions 07/15/22  Yes Amir Glaus T, PA-C      Allergies    Patient has no known allergies.    Review of Systems   Review of Systems  Musculoskeletal:  Positive for arthralgias.  All other systems reviewed and are negative.   Physical Exam Updated Vital Signs BP (!) 137/91 (BP Location: Right Arm)   Pulse 82   Temp 98.5 F (36.9 C) (Oral)   Resp 18   Ht 5\' 3"  (1.6 m)   Wt 90.7 kg   LMP 06/13/2022 (Exact Date)   SpO2 100%   BMI 35.43 kg/m  Physical Exam Vitals and nursing note reviewed.  Constitutional:      Appearance: Normal appearance.  HENT:     Head: Normocephalic and atraumatic.  Eyes:     Conjunctiva/sclera: Conjunctivae normal.  Cardiovascular:     Pulses:          Radial pulses are 2+ on the right side and 2+ on the left side.  Pulmonary:     Effort: Pulmonary effort is normal. No respiratory distress.  Musculoskeletal:     Comments: No bony abnormality or tenderness palpation of the right shoulder.  Pain with ranging right shoulder in extension, and reaching overhead.  Normal grip strength.  Skin:    General: Skin is warm and dry.   Neurological:     Mental Status: She is alert.     Comments: Normal sensation in bilateral upper extremities  Psychiatric:        Mood and Affect: Mood normal.        Behavior: Behavior normal.     ED Results / Procedures / Treatments   Labs (all labs ordered are listed, but only abnormal results are displayed) Labs Reviewed - No data to display  EKG None  Radiology DG Shoulder Right  Result Date: 07/15/2022 CLINICAL DATA:  pain x 3 weeks EXAM: RIGHT SHOULDER - 2+ VIEW COMPARISON:  Chest XR, 09/07/2014.  T-spine XRs, 03/18/2013. FINDINGS: There is no evidence of fracture or dislocation. There is no evidence of arthropathy or other focal bone abnormality. Soft tissues are unremarkable. IMPRESSION: No acute displaced fracture, dislocation or significant degenerative change within the RIGHT shoulder. Electronically Signed   By: Roanna Banning M.D.   On: 07/15/2022 15:02    Procedures Procedures    Medications Ordered in ED Medications - No data to display  ED Course/ Medical Decision Making/ A&P                             Medical Decision Making Amount and/or Complexity  of Data Reviewed Radiology: ordered.   This patient is a 27 y.o. female  who presents to the ED for concern of right shoulder pain for the past 3 weeks.   Differential diagnoses prior to evaluation: The emergent differential diagnosis includes, but is not limited to, fracture, dislocation, ligamentous injury, muscle strain, DVT, cellulitis. This is not an exhaustive differential.   Past Medical History / Co-morbidities / Social History: Back pain, migraines  Physical Exam: Physical exam performed. The pertinent findings include: Per tensive, otherwise vital signs.  No acute distress.  Pain with shoulder extension, no bony abnormality.  Normal grip strength and sensation.  Lab Tests/Imaging studies: I personally interpreted labs/imaging and the pertinent results include: X-ray of the right shoulder  unremarkable. I agree with the radiologist interpretation.  Disposition: After consideration of the diagnostic results and the patients response to treatment, I feel that emergency department workup does not suggest an emergent condition requiring admission or immediate intervention beyond what has been performed at this time. The plan is: Discharge to home with symptomatic management of likely right shoulder overuse injury, favoring tendinitis.  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  She has been taking over-the-counter medications, will prescribe Medrol Dosepak.  Recommending follow-up with orthopedist if symptoms do not improve.. The patient is safe for discharge and has been instructed to return immediately for worsening symptoms, change in symptoms or any other concerns.  Final Clinical Impression(s) / ED Diagnoses Final diagnoses:  Acute pain of right shoulder    Rx / DC Orders ED Discharge Orders          Ordered    methylPREDNISolone (MEDROL DOSEPAK) 4 MG TBPK tablet        07/15/22 1528           Portions of this report may have been transcribed using voice recognition software. Every effort was made to ensure accuracy; however, inadvertent computerized transcription errors may be present.    Su Monks, PA-C 07/15/22 1540    Rexford Maus, [place] 07/15/22 1923

## 2022-07-15 NOTE — ED Triage Notes (Signed)
Pt reports right shoulder pain x 3 weeks. Over this past week, the pain has gotten worse. Patient has taken tylenol and ibuprofen without much relief.  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  Pt a/o x 4 and ambulatory today.

## 2022-07-27 ENCOUNTER — Ambulatory Visit (HOSPITAL_COMMUNITY)
Admission: EM | Admit: 2022-07-27 | Discharge: 2022-07-27 | Disposition: A | Discharge status: Home / Self Care | Payer: Medicaid Other | Attending: Emergency Medicine | Admitting: Emergency Medicine

## 2022-07-27 ENCOUNTER — Ambulatory Visit: Payer: Self-pay

## 2022-07-27 ENCOUNTER — Encounter (HOSPITAL_COMMUNITY): Payer: Self-pay

## 2022-07-27 DIAGNOSIS — M25511 Pain in right shoulder: Secondary | ICD-10-CM | POA: Diagnosis not present

## 2022-07-27 MED ORDER — METHOCARBAMOL 500 MG PO TABS: 500.0000 mg | ORAL_TABLET | Freq: Two times a day (BID) | ORAL | 0 refills | Status: DC

## 2022-07-27 MED ORDER — DEXAMETHASONE SODIUM PHOSPHATE 10 MG/ML IJ SOLN
10.0000 mg | Freq: Once | INTRAMUSCULAR | Status: AC
  Administered 2022-07-27: 10 mg via INTRAMUSCULAR

## 2022-07-27 NOTE — ED Provider Notes (Signed)
[facility]    CSN: [id number] Arrival date & time: 07/27/22  1341      History   Chief Complaint Chief Complaint  Patient presents with   Shoulder Pain    HPI Samantha Andrews is a 27 y.o. female.   Patient presents to clinic for right scapular pain. She was recently seen at an ED where she had negative shoulder imaging and was sent home on steroid dose pack. She finished this about a week ago and her pain has returned. Reports pain got worse last night, no injures or over-use. No right arm weakness or numbness.   Pain is not reproducible to palpation, but elicited with movement of right shoulder.   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 She does not have a PCP. Does not have an orthopedic.    The history is provided by the patient and medical records.  Shoulder Pain Associated symptoms: back pain     Past Medical History:  Diagnosis Date   Back pain    Migraines     Patient Active Problem List   Diagnosis Date Noted   Chlamydia 02/20/2018    Past Surgical History:  Procedure Laterality Date   NO PAST SURGERIES      OB History     Gravida  3   Para      Term      Preterm      AB  2   Living         SAB  2   IAB  0   Ectopic      Multiple      Live Births               Home Medications    Prior to Admission medications   Medication Sig Start Date End Date Taking? Authorizing Provider  methocarbamol (ROBAXIN) 500 MG tablet Take 1 tablet (500 mg total) by mouth 2 (two) times daily. 07/27/22  Yes Kailand Seda, [place] N, FNP    Family History History reviewed. No pertinent family history.  Social History Social History   Tobacco Use   Smoking status: Never   Smokeless tobacco: Never  Substance Use Topics   Alcohol use: No   Drug use: Yes    Types: Marijuana     Allergies   Patient has no known allergies.   Review of Systems Review of Systems   Musculoskeletal:  Positive for back pain.     Physical Exam Triage Vital Signs ED Triage Vitals  Encounter Vitals Group     BP 07/27/22 1358 132/85     Systolic BP Percentile --      Diastolic BP Percentile --      Pulse Rate 07/27/22 1358 92     Resp 07/27/22 1358 16     Temp 07/27/22 1358 99 F (37.2 C)     Temp src --      SpO2 07/27/22 1358 97 %     Weight 07/27/22 1357 200 lb (90.7 kg)     Height 07/27/22 1357 5\' 3"  (1.6 m)     Head Circumference --      Peak Flow --      Pain Score 07/27/22 1357 5     Pain Loc --      Pain Education --      Exclude from Growth Chart --    No data found.  Updated Vital Signs BP 132/85 (  BP Location: Right Arm)   Pulse 92   Temp 99 F (37.2 C)   Resp 16   Ht 5\' 3"  (1.6 m)   Wt 200 lb (90.7 kg)   LMP 06/13/2022 (Exact Date) Comment: negative pregnancy test on 07/25/2022  SpO2 97%   Breastfeeding No   BMI 35.43 kg/m   Visual Acuity Right Eye Distance:   Left Eye Distance:   Bilateral Distance:    Right Eye Near:   Left Eye Near:    Bilateral Near:     Physical Exam Vitals and nursing note reviewed.  Constitutional:      Appearance: Normal appearance.  HENT:     Head: Normocephalic and atraumatic.     Right Ear: External ear normal.     Left Ear: External ear normal.     Nose: Nose normal.     Mouth/Throat:     Mouth: Mucous membranes are moist.  Eyes:     Conjunctiva/sclera: Conjunctivae normal.  Cardiovascular:     Rate and Rhythm: Normal rate.     Pulses: Normal pulses.  Pulmonary:     Effort: Pulmonary effort is normal. No respiratory distress.  Musculoskeletal:        General: No swelling, tenderness, deformity or signs of injury. Normal range of motion.     Right shoulder: No tenderness. Normal range of motion. Normal strength. Normal pulse.       Arms:     Cervical back: Normal range of motion.     Comments: Right scapular pain elicited w/ movement   Skin:    General: Skin is warm and dry.      Capillary Refill: Capillary refill takes less than 2 seconds.  Neurological:     General: No focal deficit present.     Mental Status: She is alert and oriented to person, place, and time.  Psychiatric:        Mood and Affect: Mood normal.        Behavior: Behavior is cooperative.      UC Treatments / Results  Labs (all labs ordered are listed, but only abnormal results are displayed) Labs Reviewed - No data to display  EKG   Radiology No results found.  Procedures Procedures (including critical care time)  Medications Ordered in UC Medications  dexamethasone (DECADRON) injection 10 mg (10 mg Intramuscular Given 07/27/22 1425)    Initial Impression / Assessment and Plan / UC Course  I have reviewed the triage vital signs and the nursing notes.  Pertinent labs & imaging results that were available during my care of the patient were reviewed by me and considered in my medical decision making (see chart for details).  Vitals and triage reviewed, patient is hemodynamically stable. Right scapular pain elicited with movement. Strength and pulses intact. Negative x-rays earlier this month, no injuries or new symptoms to repeat imaging. Will trial IM steroid and muscle relaxer. Staff to schedule w/ a PCP, encouraged ortho follow-up as well. POC, f/u care and return precautions given, no questions at this time.      Final Clinical Impressions(s) / UC Diagnoses   Final diagnoses:  Acute pain of right shoulder     Discharge Instructions      Your pain appears to be musculoskeletal in nature. Please use gentle stretching, warm compress and muscle relaxers as needed for pain. Do not drink or drive on the muscle relaxers as they can make you drowsy. I highly suggest following up with an orthopedic for your  ongoing back pain and chronic back issues.   Return to clinic for new or urgent symptoms. Please establish with a primary care provider, who can then refer you to an orthopedic  provider.      ED Prescriptions     Medication Sig Dispense Auth. Provider   methocarbamol (ROBAXIN) 500 MG tablet Take 1 tablet (500 mg total) by mouth 2 (two) times daily. 20 tablet Mallori Araque, [place] N, [place]      PDMP not reviewed this encounter.   Lashawnna Lambrecht, [place] N, [place] 07/27/22 1436

## 2022-07-27 NOTE — Discharge Instructions (Addendum)
Your pain appears to be musculoskeletal in nature. Please use gentle stretching, warm compress and muscle relaxers as needed for pain. Do not drink or drive on the muscle relaxers as they can make you drowsy. I highly suggest following up with an orthopedic for your ongoing back pain and chronic back issues.   Return to clinic for new or urgent symptoms. Please establish with a primary care provider, who can then refer you to an orthopedic provider.

## 2022-07-27 NOTE — ED Triage Notes (Signed)
Patient here today with c/o right posterior shoulder pain for over a month now. She went to the hospital a couple weeks ago and was given a steroid. Patient states that it helped while she was taking it but the pain returned a few days after finishing the medication. Increased pain with ROM and lifting.

## 2022-07-30 ENCOUNTER — Encounter (HOSPITAL_COMMUNITY): Payer: Self-pay

## 2022-07-31 ENCOUNTER — Ambulatory Visit: Payer: Medicaid Other

## 2022-08-06 ENCOUNTER — Ambulatory Visit: Payer: Medicaid Other | Admitting: Orthopedic Surgery

## 2022-10-10 ENCOUNTER — Ambulatory Visit: Payer: Medicaid Other | Admitting: Nurse Practitioner

## 2022-10-10 ENCOUNTER — Ambulatory Visit (HOSPITAL_COMMUNITY)
Admission: RE | Admit: 2022-10-10 | Discharge: 2022-10-10 | Disposition: A | Discharge status: Home / Self Care | Payer: Medicaid Other | Source: Ambulatory Visit | Attending: Nurse Practitioner | Admitting: Nurse Practitioner

## 2022-10-10 ENCOUNTER — Encounter: Payer: Self-pay | Admitting: Nurse Practitioner

## 2022-10-10 VITALS — BP 127/65 | HR 86 | Temp 97.7°F | Wt 226.0 lb

## 2022-10-10 DIAGNOSIS — M549 Dorsalgia, unspecified: Secondary | ICD-10-CM | POA: Diagnosis not present

## 2022-10-10 DIAGNOSIS — Z23 Encounter for immunization: Secondary | ICD-10-CM | POA: Diagnosis not present

## 2022-10-10 DIAGNOSIS — M546 Pain in thoracic spine: Secondary | ICD-10-CM | POA: Diagnosis not present

## 2022-10-10 DIAGNOSIS — M5441 Lumbago with sciatica, right side: Secondary | ICD-10-CM

## 2022-10-10 DIAGNOSIS — G8929 Other chronic pain: Secondary | ICD-10-CM | POA: Insufficient documentation

## 2022-10-10 MED ORDER — KETOROLAC TROMETHAMINE 60 MG/2ML IM SOLN
60.0000 mg | Freq: Once | INTRAMUSCULAR | Status: AC
  Administered 2022-10-10: 60 mg via INTRAMUSCULAR

## 2022-10-10 NOTE — Patient Instructions (Signed)
1. Chronic midline thoracic back pain  - DG Thoracic Spine 2 View - ketorolac (TORADOL) injection 60 mg  2. Chronic bilateral low back pain with right-sided sciatica  - DG Lumbar Spine 2-3 Views - ketorolac (TORADOL) injection 60 mg  3. Need for influenza vaccination  - Flu vaccine trivalent PF, 6mos and older(Flulaval,Afluria,Fluarix,Fluzone)  Follow up:  Follow up in 3 months

## 2022-10-10 NOTE — Progress Notes (Signed)
Subjective   Patient ID: Samantha Andrews, female    DOB: 04-02-1995, 27 y.o.   MRN: [id number]  Chief Complaint  Patient presents with   Establish Care   Back Pain    Increasing over time    Referring provider: No ref. provider found  Samantha Andrews is a 27 y.o. female with Past Medical History: No date: Back pain 12/24/2013: Butterfly vertebra     Comment:  [facility]   02/20/2018: Chlamydia No date: Migraines   Back Pain This is a chronic problem. The current episode started in the past 7 days. The problem has been gradually worsening since onset. The pain is present in the thoracic spine and lumbar spine. The quality of the pain is described as stabbing. The pain does not radiate. The pain is at a severity of 8/10. The pain is moderate. The pain is Worse during the night. The symptoms are aggravated by bending, lying down, position and twisting. Associated symptoms include tingling. Risk factors include obesity. She has tried home exercises and muscle relaxant for the symptoms. The treatment provided no relief.   Of note, she does have chronic back pain and was diagnosed with butterfly vertebrae in 2015.   Denies f/c/s, n/v/d, hemoptysis, PND, leg swelling Denies chest pain or edema    No Known Allergies  Immunization History  Administered Date(s) Administered   Influenza, Seasonal, Injecte, Preservative Fre 10/10/2022    Tobacco History: Social History   Tobacco Use  Smoking Status Never  Smokeless Tobacco Never   Counseling given: Not Answered   Outpatient Encounter Medications as of 10/10/2022  Medication Sig   methocarbamol (ROBAXIN) 500 MG tablet Take 1 tablet (500 mg total) by mouth 2 (two) times daily. (Patient not taking: Reported on 10/10/2022)   [EXPIRED] ketorolac (TORADOL) injection 60 mg    No facility-administered encounter medications on file as of 10/10/2022.    Review of Systems  Review of Systems  Constitutional:  Negative.   HENT: Negative.    Cardiovascular: Negative.   Gastrointestinal: Negative.   Musculoskeletal:  Positive for back pain.  Allergic/Immunologic: Negative.   Neurological:  Positive for tingling.  Psychiatric/Behavioral: Negative.       Objective:   BP 127/65   Pulse 86   Temp 97.7 F (36.5 C)   Wt 226 lb (102.5 kg)   LMP 08/14/2022   SpO2 100%   BMI 40.03 kg/m   Wt Readings from Last 5 Encounters:  10/10/22 226 lb (102.5 kg)  07/27/22 200 lb (90.7 kg)  07/15/22 200 lb (90.7 kg)  08/06/19 (!) 211 lb 9.6 oz (96 kg)  04/28/19 175 lb (79.4 kg)     Physical Exam Vitals and nursing note reviewed.  Constitutional:      General: She is not in acute distress.    Appearance: She is well-developed.  Cardiovascular:     Rate and Rhythm: Normal rate and regular rhythm.  Pulmonary:     Effort: Pulmonary effort is normal.     Breath sounds: Normal breath sounds.  Musculoskeletal:     Thoracic back: Tenderness present. Decreased range of motion.     Lumbar back: Tenderness present. Decreased range of motion.  Neurological:     Mental Status: She is alert and oriented to person, place, and time.  Psychiatric:        Mood and Affect: Mood normal.        Behavior: Behavior normal.       Assessment & Plan:  Chronic midline thoracic back pain -     DG Thoracic Spine 2 View -     Ketorolac Tromethamine -     Ambulatory referral to Orthopedics  Chronic bilateral low back pain with right-sided sciatica -     DG Lumbar Spine 2-3 Views -     Ketorolac Tromethamine -     Ambulatory referral to Orthopedics  Need for influenza vaccination -     Flu vaccine trivalent PF, 6mos and older(Flulaval,Afluria,Fluarix,Fluzone)     Return in about 3 months (around 01/10/2023) for back pain.   Ivonne Andrew, NP 11/22/2022

## 2022-10-16 ENCOUNTER — Encounter: Payer: Self-pay | Admitting: Obstetrics & Gynecology

## 2022-10-16 ENCOUNTER — Encounter: Payer: Medicaid Other | Admitting: Obstetrics & Gynecology

## 2022-10-17 ENCOUNTER — Encounter: Payer: Self-pay | Admitting: Physician Assistant

## 2022-10-17 ENCOUNTER — Ambulatory Visit: Payer: Medicaid Other | Admitting: Physician Assistant

## 2022-10-17 DIAGNOSIS — M546 Pain in thoracic spine: Secondary | ICD-10-CM | POA: Diagnosis not present

## 2022-10-17 DIAGNOSIS — M5440 Lumbago with sciatica, unspecified side: Secondary | ICD-10-CM | POA: Diagnosis not present

## 2022-10-17 DIAGNOSIS — M545 Low back pain, unspecified: Secondary | ICD-10-CM | POA: Insufficient documentation

## 2022-10-17 NOTE — Progress Notes (Signed)
Office Visit Note   Patient: Samantha Andrews           Date of Birth: Aug 21, 1995           MRN: [id number] Visit Date: 10/17/2022              Requested by: Ivonne Andrew, NP [phone] [address] [place],  [place] [address] PCP: Ivonne Andrew, NP   Assessment & Plan: Visit Diagnoses:  1. Acute right-sided thoracic back pain   2. Acute right-sided low back pain with sciatica, sciatica laterality unspecified     Plan: Patient is a pleasant 27 year old woman with a 10-year history of thoracic back pain on the right.  This is now going down into her lower back and complaining of paresthesias going into her right anterior thigh.  No history of injury.  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  I discussed this with Dr. Christell Constant.  She has had progression of radicular symptoms in her lower back her x-rays of her lumbar spine do not demonstrate any acute abnormalities.  Will go forward with PT.  He will follow-up with her once this is completed  Follow-Up Instructions: No follow-ups on file.   Orders:  No orders of the defined types were placed in this encounter.  No orders of the defined types were placed in this encounter.     Procedures: No procedures performed   Clinical Data: No additional findings.   Subjective: Chief Complaint  Patient presents with   Lower Back - Pain   Middle Back - Pain    HPI pleasant 27 year old woman with progressive thoracic and right sided low back pain.  Denies any injuries.  Has had this on and off for 10 years but is now having radicular symptoms going down her right thigh.  Denies any loss of bowel or bladder control rates her pain as moderate has not been helped by a steroid taper or anti-inflammatories  Review of Systems  All other systems  reviewed and are negative.    Objective: Vital Signs: LMP 08/14/2022   Physical Exam Constitutional:      Appearance: Normal appearance.  Pulmonary:     Effort: Pulmonary effort is normal.  Skin:    General: Skin is warm and dry.  Neurological:     General: No focal deficit present.     Mental Status: She is alert.  Psychiatric:        Mood and Affect: Mood normal.        Behavior: Behavior normal.     Ortho Exam Examination of her back she has no scapular winging she is neurovascularly intact she does have some tenderness over the right sixth lower scapula.  Good range of motion of her shoulder no apprehension she has 5 out of 5 strength with resisted dorsiflexion plantarflexion extension flexion of her legs Specialty Comments:  No specialty comments available.  Imaging: No results found.   PMFS History: Patient Active Problem List   Diagnosis Date Noted   Thoracic back pain 10/17/2022   Low back pain 10/17/2022   Past Medical History:  Diagnosis Date   Back pain    Butterfly vertebra 12/24/2013   [facility]     Chlamydia 02/20/2018   Migraines  History reviewed. No pertinent family history.  Past Surgical History:  Procedure Laterality Date   NO PAST SURGERIES     Social History   Occupational History   Not on file  Tobacco Use   Smoking status: Never   Smokeless tobacco: Never  Substance and Sexual Activity   Alcohol use: No   Drug use: Yes    Types: Marijuana   Sexual activity: Yes    Birth control/protection: None

## 2022-11-02 ENCOUNTER — Ambulatory Visit: Payer: Medicaid Other | Attending: Physician Assistant | Admitting: Physical Therapy

## 2022-11-02 NOTE — Therapy (Deleted)
OUTPATIENT PHYSICAL THERAPY THORACOLUMBAR EVALUATION   Patient Name: Samantha Andrews MRN: [id number] DOB:Mar 31, 1995, 27 y.o., female Today's Date: 11/02/2022  END OF SESSION:   Past Medical History:  Diagnosis Date   Back pain    Butterfly vertebra 12/24/2013   [facility]     Chlamydia 02/20/2018   Migraines    Past Surgical History:  Procedure Laterality Date   NO PAST SURGERIES     Patient Active Problem List   Diagnosis Date Noted   Thoracic back pain 10/17/2022   Low back pain 10/17/2022    PCP: ***  REFERRING PROVIDER: ***  REFERRING DIAG: ***  Rationale for Evaluation and Treatment: {HABREHAB:27488}  THERAPY DIAG:  No diagnosis found.  ONSET DATE: ***  SUBJECTIVE:                                                                                                                                                                                           SUBJECTIVE STATEMENT: ***  PERTINENT HISTORY:  ***  PAIN:  Are you having pain? {OPRCPAIN:27236}  PRECAUTIONS: {Therapy precautions:24002}  RED FLAGS: {PT Red Flags:29287}   WEIGHT BEARING RESTRICTIONS: {Yes ***/No:24003}  FALLS:  Has patient fallen in last 6 months? {fallsyesno:27318}  LIVING ENVIRONMENT: Lives with: {OPRC lives with:25569::"lives with their family"} Lives in: {Lives in:25570} Stairs: {opstairs:27293} Has following equipment at home: {Assistive devices:23999}  OCCUPATION: ***  PLOF: {PLOF:24004}  PATIENT GOALS: ***  NEXT MD VISIT: ***  OBJECTIVE:  Note: Objective measures were completed at Evaluation unless otherwise noted.  DIAGNOSTIC FINDINGS:  ***  PATIENT SURVEYS:  {rehab surveys:24030}  SCREENING FOR RED FLAGS: Bowel or bladder incontinence: {Yes/No:304960894} Spinal tumors: {Yes/No:304960894} Cauda equina syndrome: {Yes/No:304960894} Compression fracture: {Yes/No:304960894} Abdominal aneurysm: {Yes/No:304960894}  COGNITION: Overall  cognitive status: {cognition:24006}     SENSATION: {sensation:27233}  MUSCLE LENGTH: Hamstrings: Right *** deg; Left *** deg Thomas test: Right *** deg; Left *** deg  POSTURE: {posture:25561}  PALPATION: ***  LUMBAR ROM:   AROM eval  Flexion   Extension   Right lateral flexion   Left lateral flexion   Right rotation   Left rotation    (Blank rows = not tested)  LOWER EXTREMITY ROM:     {AROM/PROM:27142}  Right eval Left eval  Hip flexion    Hip extension    Hip abduction    Hip adduction    Hip internal rotation    Hip external rotation    Knee flexion    Knee extension    Ankle dorsiflexion    Ankle plantarflexion    Ankle inversion    Ankle eversion     (Blank rows =  not tested)  LOWER EXTREMITY MMT:    MMT Right eval Left eval  Hip flexion    Hip extension    Hip abduction    Hip adduction    Hip internal rotation    Hip external rotation    Knee flexion    Knee extension    Ankle dorsiflexion    Ankle plantarflexion    Ankle inversion    Ankle eversion     (Blank rows = not tested)  LUMBAR SPECIAL TESTS:  {lumbar special test:25242}  FUNCTIONAL TESTS:  {Functional tests:24029}  GAIT: Distance walked: *** Assistive device utilized: {Assistive devices:23999} Level of assistance: {Levels of assistance:24026} Comments: ***  TODAY'S TREATMENT:                                                                                                                              DATE: ***    PATIENT EDUCATION:  Education details: *** Person educated: {Person educated:25204} Education method: {Education Method:25205} Education comprehension: {Education Comprehension:25206}  HOME EXERCISE PROGRAM: ***  ASSESSMENT:  CLINICAL IMPRESSION: Patient is a *** y.o. *** who was seen today for physical therapy evaluation and treatment for ***.   OBJECTIVE IMPAIRMENTS: {opptimpairments:25111}.   ACTIVITY LIMITATIONS:  {activitylimitations:27494}  PARTICIPATION LIMITATIONS: {participationrestrictions:25113}  PERSONAL FACTORS: {Personal factors:25162} are also affecting patient's functional outcome.   REHAB POTENTIAL: {rehabpotential:25112}  CLINICAL DECISION MAKING: {clinical decision making:25114}  EVALUATION COMPLEXITY: {Evaluation complexity:25115}   GOALS: Goals reviewed with patient? {yes/no:20286}  SHORT TERM GOALS: Target date: ***  *** Baseline: Goal status: INITIAL  2.  *** Baseline:  Goal status: INITIAL  3.  *** Baseline:  Goal status: INITIAL  4.  *** Baseline:  Goal status: INITIAL  5.  *** Baseline:  Goal status: INITIAL  6.  *** Baseline:  Goal status: INITIAL  LONG TERM GOALS: Target date: ***  *** Baseline:  Goal status: INITIAL  2.  *** Baseline:  Goal status: INITIAL  3.  *** Baseline:  Goal status: INITIAL  4.  *** Baseline:  Goal status: INITIAL  5.  *** Baseline:  Goal status: INITIAL  6.  *** Baseline:  Goal status: INITIAL  PLAN:  PT FREQUENCY: {rehab frequency:25116}  PT DURATION: {rehab duration:25117}  PLANNED INTERVENTIONS: {rehab planned interventions:25118::"97110-Therapeutic exercises","97530- Therapeutic [phone]- Neuromuscular re-education","97535- Self SAYT","01601- Manual therapy"}.  PLAN FOR NEXT SESSION: ***   Iliyana Convey, PT 11/02/2022, 7:57 AM

## 2022-11-16 ENCOUNTER — Ambulatory Visit: Payer: Self-pay | Admitting: Nurse Practitioner

## 2022-11-30 ENCOUNTER — Ambulatory Visit: Payer: Self-pay | Admitting: Nurse Practitioner

## 2022-12-17 ENCOUNTER — Encounter: Payer: Medicaid Other | Admitting: Orthopedic Surgery

## 2023-01-11 ENCOUNTER — Ambulatory Visit: Payer: Self-pay | Admitting: Nurse Practitioner

## 2023-01-31 ENCOUNTER — Encounter: Payer: Self-pay | Admitting: Nurse Practitioner

## 2023-01-31 ENCOUNTER — Ambulatory Visit: Payer: Medicaid Other | Admitting: Nurse Practitioner

## 2023-01-31 DIAGNOSIS — Z139 Encounter for screening, unspecified: Secondary | ICD-10-CM

## 2023-01-31 DIAGNOSIS — F419 Anxiety disorder, unspecified: Secondary | ICD-10-CM | POA: Diagnosis not present

## 2023-01-31 DIAGNOSIS — F32A Depression, unspecified: Secondary | ICD-10-CM

## 2023-01-31 DIAGNOSIS — R109 Unspecified abdominal pain: Secondary | ICD-10-CM | POA: Diagnosis not present

## 2023-01-31 DIAGNOSIS — Z23 Encounter for immunization: Secondary | ICD-10-CM

## 2023-01-31 DIAGNOSIS — Z13220 Encounter for screening for lipoid disorders: Secondary | ICD-10-CM

## 2023-01-31 LAB — POCT URINALYSIS DIP (CLINITEK)
Bilirubin, UA: NEGATIVE
Blood, UA: NEGATIVE
Glucose, UA: NEGATIVE mg/dL
Ketones, POC UA: NEGATIVE mg/dL
Leukocytes, UA: NEGATIVE
Nitrite, UA: NEGATIVE
POC PROTEIN,UA: NEGATIVE
Spec Grav, UA: >=1.03 — AB (ref 1.010–1.025)
Urobilinogen, UA: 0.2 U/dL
pH, UA: 5.5 (ref 5.0–8.0)

## 2023-01-31 MED ORDER — FLUOXETINE HCL 10 MG PO TABS: 10.0000 mg | ORAL_TABLET | Freq: Every day | ORAL | 0 refills | Status: DC

## 2023-01-31 MED ORDER — HYDROXYZINE PAMOATE 25 MG PO CAPS: 25.0000 mg | ORAL_CAPSULE | Freq: Three times a day (TID) | ORAL | 0 refills | Status: DC | PRN

## 2023-01-31 MED ORDER — PROBIOTIC DAILY PO CAPS: 1.0000 | ORAL_CAPSULE | Freq: Every day | ORAL | 2 refills | Status: DC

## 2023-01-31 NOTE — Patient Instructions (Addendum)
1. Immunization due (Primary)  - Tdap vaccine greater than or equal to 26yo IM  2. Anxiety and depression  - AMB Referral VBCI Care Management - Ambulatory referral to Psychiatry - hydrOXYzine (VISTARIL) 25 MG capsule; Take 1 capsule (25 mg total) by mouth every 8 (eight) hours as needed.  Dispense: 30 capsule; Refill: 0 - FLUoxetine (PROZAC) 10 MG tablet; Take 1 tablet (10 mg total) by mouth daily.  Dispense: 30 tablet; Refill: 0  3. Encounter for screening involving social determinants of health (SDoH)  - AMB Referral VBCI Care Management  4. Abdominal cramping  - Probiotic Product (PROBIOTIC DAILY) CAPS; Take 1 capsule by mouth daily.  Dispense: 30 capsule; Refill: 2   Follow up:  Follow up in 4 weeks

## 2023-01-31 NOTE — Progress Notes (Signed)
Subjective   Patient ID: Samantha Andrews, female    DOB: Aug 11, 1995, 28 y.o.   MRN: [id number]  Chief Complaint  Patient presents with   Back Pain   Abdominal Pain    Referring provider: Ivonne Andrew, NP  Samantha Andrews is a 28 y.o. female with Past Medical History: No date: Back pain 12/24/2013: Butterfly vertebra     Comment:  [facility]   02/20/2018: Chlamydia No date: Migraines   HPI  Patient presents today for an acute visit.  She states that she has been having anxiety and panic attacks.  Denies any suicidal thoughts.  She has been having issues with housing and food insecurity.  We will place a referral for social worker and counselor.  Will also place a referral to psychiatry.  Will start patient on low-dose Prozac.  Also complains of Back pain - saw ortho but is waiting to see spine specialist - did not have transportation to scheduled appointment. Also complains of Stomach pains / cramps - lower abdominal pain. Couple times per week for the past few months.  We discussed that her abdominal pain could be related to anxiety and stress.  We will trial probiotic.  If symptoms do not get better we discussed that she would need to call us.  She denies diarrhea or constipation.  Denies f/c/s, n/v/d, hemoptysis, PND, leg swelling Denies chest pain or edema       No Known Allergies  Immunization History  Administered Date(s) Administered   Influenza, Seasonal, Injecte, Preservative Fre 10/10/2022   Tdap 01/31/2023    Tobacco History: Social History   Tobacco Use  Smoking Status Never  Smokeless Tobacco Never   Counseling given: Not Answered   Outpatient Encounter Medications as of 01/31/2023  Medication Sig   FLUoxetine (PROZAC) 10 MG tablet Take 1 tablet (10 mg total) by mouth daily.   hydrOXYzine (VISTARIL) 25 MG capsule Take 1 capsule (25 mg total) by mouth every 8 (eight) hours as needed.   Probiotic Product (PROBIOTIC DAILY) CAPS  Take 1 capsule by mouth daily.   methocarbamol (ROBAXIN) 500 MG tablet Take 1 tablet (500 mg total) by mouth 2 (two) times daily. (Patient not taking: Reported on 01/31/2023)   No facility-administered encounter medications on file as of 01/31/2023.    Review of Systems  Review of Systems  HENT: Negative.    Psychiatric/Behavioral:  Negative for self-injury and suicidal ideas. The patient is nervous/anxious.      Objective:   BP 138/80   Pulse 87   Temp (!) 97.2 F (36.2 C)   Wt 210 lb (95.3 kg)   SpO2 100%   BMI 37.20 kg/m   Wt Readings from Last 5 Encounters:  01/31/23 210 lb (95.3 kg)  10/10/22 226 lb (102.5 kg)  07/27/22 200 lb (90.7 kg)  07/15/22 200 lb (90.7 kg)  08/06/19 (!) 211 lb 9.6 oz (96 kg)     Physical Exam Vitals and nursing note reviewed.  Constitutional:      General: She is not in acute distress.    Appearance: She is well-developed.  Cardiovascular:     Rate and Rhythm: Regular rhythm.  Pulmonary:     Effort: Pulmonary effort is normal.     Breath sounds: Normal breath sounds.  Neurological:     Mental Status: She is alert and oriented to person, place, and time.       Assessment & Plan:   Immunization due -  Tdap vaccine greater than or equal to 7yo IM  Anxiety and depression -     AMB Referral VBCI Care Management -     Ambulatory referral to Psychiatry -     hydrOXYzine Pamoate; Take 1 capsule (25 mg total) by mouth every 8 (eight) hours as needed.  Dispense: 30 capsule; Refill: 0 -     FLUoxetine HCl; Take 1 tablet (10 mg total) by mouth daily.  Dispense: 30 tablet; Refill: 0  Encounter for screening involving social determinants of health (SDoH) -     AMB Referral VBCI Care Management  Abdominal cramping -     Probiotic Daily; Take 1 capsule by mouth daily.  Dispense: 30 capsule; Refill: 2 -     CBC -     Comprehensive metabolic panel  Lipid screening -     Lipid panel     Return in about 4 weeks (around 02/28/2023) for  anxiety.     Ivonne Andrew, NP 01/31/2023

## 2023-02-01 ENCOUNTER — Other Ambulatory Visit: Payer: Self-pay | Admitting: Nurse Practitioner

## 2023-02-01 DIAGNOSIS — F419 Anxiety disorder, unspecified: Secondary | ICD-10-CM

## 2023-02-01 DIAGNOSIS — F32A Depression, unspecified: Secondary | ICD-10-CM

## 2023-02-01 LAB — LIPID PANEL
Chol/HDL Ratio: 5.7 {ratio} — ABNORMAL HIGH (ref 0.0–4.4)
Cholesterol, Total: 217 mg/dL — ABNORMAL HIGH (ref 100–199)
HDL: 38 mg/dL — ABNORMAL LOW (ref >39)
LDL Chol Calc (NIH): 150 mg/dL — ABNORMAL HIGH (ref 0–99)
Triglycerides: 161 mg/dL — ABNORMAL HIGH (ref 0–149)
VLDL Cholesterol Cal: 29 mg/dL (ref 5–40)

## 2023-02-01 LAB — COMPREHENSIVE METABOLIC PANEL
ALT: 22 [IU]/L (ref 0–32)
AST: 14 [IU]/L (ref 0–40)
Albumin: 4.4 g/dL (ref 4.0–5.0)
Alkaline Phosphatase: 65 [IU]/L (ref 44–121)
BUN/Creatinine Ratio: 10 (ref 9–23)
BUN: 8 mg/dL (ref 6–20)
Bilirubin Total: <0.2 mg/dL (ref 0.0–1.2)
CO2: 23 mmol/L (ref 20–29)
Calcium: 9.6 mg/dL (ref 8.7–10.2)
Chloride: 104 mmol/L (ref 96–106)
Creatinine, Ser: 0.77 mg/dL (ref 0.57–1.00)
Globulin, Total: 2.3 g/dL (ref 1.5–4.5)
Glucose: 109 mg/dL — ABNORMAL HIGH (ref 70–99)
Potassium: 4.4 mmol/L (ref 3.5–5.2)
Sodium: 139 mmol/L (ref 134–144)
Total Protein: 6.7 g/dL (ref 6.0–8.5)
eGFR: 110 mL/min/{1.73_m2} (ref >59)

## 2023-02-01 LAB — CBC
Hematocrit: 42.1 % (ref 34.0–46.6)
Hemoglobin: 13.5 g/dL (ref 11.1–15.9)
MCH: 28.3 pg (ref 26.6–33.0)
MCHC: 32.1 g/dL (ref 31.5–35.7)
MCV: 88 fL (ref 79–97)
Platelets: 291 10*3/uL (ref 150–450)
RBC: 4.77 x10E6/uL (ref 3.77–5.28)
RDW: 13.1 % (ref 11.7–15.4)
WBC: 9 10*3/uL (ref 3.4–10.8)

## 2023-02-01 MED ORDER — FLUOXETINE HCL 10 MG PO CAPS: 10.0000 mg | ORAL_CAPSULE | Freq: Every day | ORAL | 2 refills | Status: DC

## 2023-02-23 DIAGNOSIS — L03115 Cellulitis of right lower limb: Secondary | ICD-10-CM | POA: Diagnosis not present

## 2023-02-23 DIAGNOSIS — L02415 Cutaneous abscess of right lower limb: Secondary | ICD-10-CM | POA: Diagnosis not present

## 2023-02-26 ENCOUNTER — Telehealth: Payer: Medicaid Other | Admitting: Nurse Practitioner

## 2023-02-26 VITALS — Ht 63.0 in | Wt 210.0 lb

## 2023-02-26 DIAGNOSIS — L02415 Cutaneous abscess of right lower limb: Secondary | ICD-10-CM | POA: Diagnosis not present

## 2023-02-26 DIAGNOSIS — L03115 Cellulitis of right lower limb: Secondary | ICD-10-CM | POA: Diagnosis not present

## 2023-02-26 NOTE — Progress Notes (Signed)
Virtual Visit via Telephone Note  I connected with Samantha Andrews @ on 02/26/23 at 4:30pm  by  video and verified that I am speaking with the correct person using two identifiers. I spent 7 minutes on this video encounter.   Location: Patient: home Provider: office   I discussed the limitations, risks, security and privacy concerns of performing an evaluation and management service by telephone and the availability of in person appointments. I also discussed with the patient that there may be a patient responsible charge related to this service. The patient expressed understanding and agreed to proceed.   History of Present Illness: Patient had presented to the urgent care on 02/23/2023 for complaints of abscess and pain to her right inner thigh  that started 4 days prior to her visit to urgent care, she has never had an abscess in the past. Abscess continues to drain pus and blood , taking Bactrim DS 800-160mg  BID as ordered for 14 days Takes ibuprofen as needed for for pain. Started taking meds 2 days ago. She denies fever but has chills.      Observations/Objective: Patient alert and oriented, no sign of distress noted  Assessment and Plan: Cellulitis and abscess of right lower extremity Patient encouraged to continue Bactrim DS 800-160 mg 1 tablet twice daily, take medications for 14 days as ordered  Take Ibuprofen as needed for pain Patient encouraged to keep sites clean and dry Follow-up in the office if wound does not completely heal after completion of 3 shots of antibiotics ordered.     I discussed the assessment and treatment plan with the patient. The patient was provided an opportunity to ask questions and all were answered. The patient agreed with the plan and demonstrated an understanding of the instructions.   The patient was advised to call back or seek an in-person evaluation if the symptoms worsen or if the condition fails to improve as anticipated.

## 2023-02-26 NOTE — Assessment & Plan Note (Signed)
Patient encouraged to continue Bactrim DS 800-160 mg 1 tablet twice daily, take medications for 14 days as ordered  Take Ibuprofen as needed for pain Patient encouraged to keep sites clean and dry Follow-up in the office if wound does not completely heal after completion of 3 shots of antibiotics ordered.

## 2023-03-04 ENCOUNTER — Other Ambulatory Visit: Payer: Self-pay

## 2023-03-04 ENCOUNTER — Ambulatory Visit: Payer: Medicaid Other | Admitting: Nurse Practitioner

## 2023-03-04 DIAGNOSIS — F32A Depression, unspecified: Secondary | ICD-10-CM

## 2023-03-04 MED ORDER — HYDROXYZINE PAMOATE 25 MG PO CAPS: 25.0000 mg | ORAL_CAPSULE | Freq: Three times a day (TID) | ORAL | 0 refills | Status: DC | PRN

## 2023-03-04 NOTE — Telephone Encounter (Signed)
 Please advise [facility]

## 2023-03-20 ENCOUNTER — Ambulatory Visit (INDEPENDENT_AMBULATORY_CARE_PROVIDER_SITE_OTHER): Payer: Self-pay | Admitting: Licensed Clinical Social Worker

## 2023-03-20 DIAGNOSIS — F3130 Bipolar disorder, current episode depressed, mild or moderate severity, unspecified: Secondary | ICD-10-CM | POA: Diagnosis not present

## 2023-03-20 DIAGNOSIS — F411 Generalized anxiety disorder: Secondary | ICD-10-CM | POA: Insufficient documentation
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
## 2023-03-27 ENCOUNTER — Ambulatory Visit (INDEPENDENT_AMBULATORY_CARE_PROVIDER_SITE_OTHER): Payer: Self-pay | Admitting: Psychiatry

## 2023-03-27 ENCOUNTER — Encounter (HOSPITAL_COMMUNITY): Payer: Self-pay | Admitting: Psychiatry

## 2023-03-27 VITALS — BP 125/80 | HR 97 | Wt 204.2 lb

## 2023-03-27 DIAGNOSIS — F1290 Cannabis use, unspecified, uncomplicated: Secondary | ICD-10-CM

## 2023-03-27 DIAGNOSIS — F411 Generalized anxiety disorder: Secondary | ICD-10-CM | POA: Diagnosis not present

## 2023-03-27 DIAGNOSIS — F410 Panic disorder [episodic paroxysmal anxiety] without agoraphobia: Secondary | ICD-10-CM

## 2023-03-27 DIAGNOSIS — F331 Major depressive disorder, recurrent, moderate: Secondary | ICD-10-CM

## 2023-03-27 DIAGNOSIS — F17200 Nicotine dependence, unspecified, uncomplicated: Secondary | ICD-10-CM | POA: Diagnosis not present

## 2023-03-27 MED ORDER — FLUOXETINE HCL 20 MG PO CAPS: 20.0000 mg | ORAL_CAPSULE | Freq: Every day | ORAL | 2 refills | Status: DC

## 2023-03-27 MED ORDER — HYDROXYZINE HCL 25 MG PO TABS: 25.0000 mg | ORAL_TABLET | Freq: Two times a day (BID) | ORAL | 0 refills | Status: DC | PRN

## 2023-03-27 MED ORDER — FLUOXETINE HCL 10 MG PO CAPS: 10.0000 mg | ORAL_CAPSULE | Freq: Every day | ORAL | 0 refills | Status: DC

## 2023-03-27 NOTE — Patient Instructions (Addendum)
 Your next visit is on May 7th at 2pm.   Please follow up with your therapist.   Please take prozac 10mg  for 1 week then increase to 20mg  the week after.

## 2023-04-19 ENCOUNTER — Telehealth: Admitting: Physician Assistant

## 2023-04-19 DIAGNOSIS — A084 Viral intestinal infection, unspecified: Secondary | ICD-10-CM | POA: Diagnosis not present

## 2023-04-19 MED ORDER — ONDANSETRON 4 MG PO TBDP: 4.0000 mg | ORAL_TABLET | Freq: Three times a day (TID) | ORAL | 0 refills | Status: DC | PRN

## 2023-04-19 NOTE — Progress Notes (Signed)

## 2023-05-01 ENCOUNTER — Ambulatory Visit (INDEPENDENT_AMBULATORY_CARE_PROVIDER_SITE_OTHER): Admitting: Licensed Clinical Social Worker

## 2023-05-01 DIAGNOSIS — F411 Generalized anxiety disorder: Secondary | ICD-10-CM

## 2023-05-01 NOTE — Progress Notes (Signed)
 THERAPIST PROGRESS NOTE  Session Time: 42   Participation Level: Active  Behavioral Response: CasualAlertAnxious and Depressed  Type of Therapy: Individual Therapy  Treatment Goals addressed:  Active     BH CCP BIPOLAR DISORDER-MANIA/HYPOMANIA     LTG: Samantha Andrews will stabilize mood and increase goal-directed behavior as measured by decrease PHQ-9 below 10      Start:  03/20/23    Expected End:  10/11/23         STG: Samantha Andrews will complete at least 80% of assigned homework      Start:  03/20/23    Expected End:  10/11/23         idenietify 3 trigger for depression      Start:  03/20/23    Expected End:  10/11/23         Decrease GAD_7 below 5      Start:  03/20/23    Expected End:  10/11/23         Work with Samantha Andrews to track symptoms, triggers, and/or skill use through a mood chart, diary card, or journal     Start:  03/20/23         Work with Samantha Andrews to discuss risks and benefits of medication treatment options for this problem and prescribe as indicated      Start:  03/20/23         Encourage Samantha Andrews to participate in recovery peer support activities      Start:  03/20/23         Conduct a review of all medications to evaluate any drug/drug interactions     Start:  03/20/23         Review with Samantha Andrews their response to the prescribed medication, including any side effects      Start:  03/20/23         Educate Samantha Andrews on any questions they may have regarding medication     Start:  03/20/23         Encourage Samantha Andrews to keep a log of medication side effects, questions regarding medication, and symptoms      Start:  03/20/23            ProgressTowards Goals: Progressing  Interventions: CBT and Motivational Interviewing  Summary: Samantha Andrews is a 28 y.o. female who presents with    Suicidal/Homicidal: Nowithout intent/plan  Therapist Response:   Samantha Andrews was alert and oriented x 5.  She was pleasant, cooperative, maintained good eye  contact.  She engaged well in therapy session was dressed casually.  Patient presented with flat, depressed, anxious, and tearful mood\affect.  Patient reports increased anxiety and depression.  This is due to stressors of moving in with her mother, losing her job due to quitting, and lack of motivation.  She endorses symptoms for worthlessness, hopelessness, poor motivation, insomnia, tension, and worry.  Samantha Andrews reports today she tends to self-destruct when things do not go according to plan.  Patient reports this is as evidenced by her not wanting to get out of bed and go to work and instead of just calling in sick she quit.  Patient reports that she needs the job and overall liked it due to the fact that she had to move in with her mother due to not paying her rent.  Patient reports that she did not make her situation better and now has to find employment on top of housing.  Intervention/plan: LCSW utilized motivational interviewing for reflective listening, open-ended questions, and positive affirmations.  LCSW spoke with patient today about the stages of change for precontemplation, contemplation, action and maintenance.  LCSW utilized psychoanalytic therapy for patient to express thoughts, feelings and emotions and nonjudgmental environment.  LCSW utilized cognitive behavioral therapy for cognitive restructuring.  LCSW educated patient on tracking her thoughts, triggers, and symptoms utilizing scaling techniques from 1-10.    Plan: Return again in 3 weeks.  Diagnosis: No diagnosis found.  Collaboration of Care: Other None today  Patient/Guardian was advised Release of Information must be obtained prior to any record release in order to collaborate their care with an outside provider. Patient/Guardian was advised if they have not already done so to contact the registration department to sign all necessary forms in order for us  to release information regarding their care.   Consent:  Patient/Guardian gives verbal consent for treatment and assignment of benefits for services provided during this visit. Patient/Guardian expressed understanding and agreed to proceed.   Maryagnes Small, LCSW 05/01/2023
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
## 2023-05-15 ENCOUNTER — Ambulatory Visit (INDEPENDENT_AMBULATORY_CARE_PROVIDER_SITE_OTHER): Admitting: Psychiatry

## 2023-05-15 VITALS — BP 133/83 | HR 103

## 2023-05-15 DIAGNOSIS — F1290 Cannabis use, unspecified, uncomplicated: Secondary | ICD-10-CM | POA: Diagnosis not present

## 2023-05-15 DIAGNOSIS — F17201 Nicotine dependence, unspecified, in remission: Secondary | ICD-10-CM

## 2023-05-15 DIAGNOSIS — F410 Panic disorder [episodic paroxysmal anxiety] without agoraphobia: Secondary | ICD-10-CM | POA: Diagnosis not present

## 2023-05-15 DIAGNOSIS — F411 Generalized anxiety disorder: Secondary | ICD-10-CM

## 2023-05-15 DIAGNOSIS — F331 Major depressive disorder, recurrent, moderate: Secondary | ICD-10-CM | POA: Diagnosis not present

## 2023-05-15 MED ORDER — FLUOXETINE HCL 20 MG PO CAPS: 20.0000 mg | ORAL_CAPSULE | Freq: Every day | ORAL | 2 refills | Status: DC

## 2023-05-15 MED ORDER — HYDROXYZINE HCL 25 MG PO TABS: 25.0000 mg | ORAL_TABLET | Freq: Two times a day (BID) | ORAL | 2 refills | Status: AC | PRN

## 2023-05-15 MED ORDER — BUSPIRONE HCL 10 MG PO TABS: 10.0000 mg | ORAL_TABLET | Freq: Two times a day (BID) | ORAL | 2 refills | Status: AC

## 2023-05-15 NOTE — Patient Instructions (Signed)
 Please start taking buspar 10mg  BID for generalized anxiety  Continue taking your prozac  daily Take your hydroxyzine  as needed for panic attacks.   Follow-up in July

## 2023-05-22 ENCOUNTER — Ambulatory Visit (INDEPENDENT_AMBULATORY_CARE_PROVIDER_SITE_OTHER): Admitting: Licensed Clinical Social Worker

## 2023-05-22 DIAGNOSIS — F411 Generalized anxiety disorder: Secondary | ICD-10-CM | POA: Diagnosis not present

## 2023-05-22 NOTE — Progress Notes (Signed)
 THERAPIST PROGRESS NOTE  Session Time: 25   Participation Level: Active  Behavioral Response: CasualAlertAnxious, Depressed, and Irritable  Type of Therapy: Individual Therapy  Treatment Goals addressed:  Active     BH CCP BIPOLAR DISORDER-MANIA/HYPOMANIA     LTG: Samantha Andrews will stabilize mood and increase goal-directed behavior as measured by decrease PHQ-9 below 10  (Progressing)     Start:  03/20/23    Expected End:  10/11/23         STG: Samantha Andrews will complete at least 80% of assigned homework  (Progressing)     Start:  03/20/23    Expected End:  10/11/23         idenietify 3 trigger for depression  (Progressing)     Start:  03/20/23    Expected End:  10/11/23         Decrease GAD_7 below 5      Start:  03/20/23    Expected End:  10/11/23         Educate Samantha Andrews on any questions they may have regarding medication     Start:  03/20/23         Encourage Samantha Andrews to keep a log of medication side effects, questions regarding medication, and symptoms      Start:  03/20/23         Work with Samantha Andrews to track symptoms, triggers, and/or skill use through a mood chart, diary card, or journal (Completed)     Start:  03/20/23    End:  05/01/23      Work with Samantha Andrews to discuss risks and benefits of medication treatment options for this problem and prescribe as indicated  (Completed)     Start:  03/20/23    End:  05/01/23      Encourage Samantha Andrews to participate in recovery peer support activities  (Completed)     Start:  03/20/23    End:  05/22/23      Conduct a review of all medications to evaluate any drug/drug interactions (Completed)     Start:  03/20/23    End:  05/22/23      Review with Samantha Andrews their response to the prescribed medication, including any side effects  (Completed)     Start:  03/20/23    End:  05/22/23         ProgressTowards Goals: Progressing  Interventions: CBT, Motivational Interviewing, and Supportive  Summary: Samantha Andrews  is a 28 y.o. female who presents with    Suicidal/Homicidal: Nowithout intent/plan  Therapist Response:     Patient was alert and oriented x 5.  Samantha Andrews was pleasant, cooperative, maintained good eye contact.  She engaged well in therapy session was dressed casually.  She presented with irritable and anxious mood\affect.  Samantha Andrews reports primary stressors as family conflict.  She reports that she is having problems with her grandmother.  Patient reports that since moving in with her mother things have been tense with her and her grandmother due to her grandmother making passive-aggressive comments about finding employment and doing more around the house.  Samantha Andrews reports that she tries to ignore the situation or walk away but feels like this is going to boil over.  Patient states that she does not want to say something mean to her as it is her grandmother and she loves her.  Patient reports frustration, tension, and worry.  Intervention/plan: LCSW utilized psychoanalytic therapy for patient to express thoughts, feelings and emotions and nonjudgmental environment.  LCSW utilized supportive therapy  for praise and encouragement.  LCSW educated patient about setting healthy boundaries with family and friends.  LCSW educated patient on communication skills such as tone of voice and how to articulate communication positively while being firm.   Plan: Return again in 3 weeks.  Diagnosis: GAD (generalized anxiety disorder)  Collaboration of Care: Other None today   Patient/Guardian was advised Release of Information must be obtained prior to any record release in order to collaborate their care with an outside provider. Patient/Guardian was advised if they have not already done so to contact the registration department to sign all necessary forms in order for us  to release information regarding their care.   Consent: Patient/Guardian gives verbal consent for treatment and assignment of benefits for  services provided during this visit. Patient/Guardian expressed understanding and agreed to proceed.   Maryagnes Small, LCSW 05/22/2023

## 2023-06-11 ENCOUNTER — Ambulatory Visit (HOSPITAL_COMMUNITY): Admitting: Licensed Clinical Social Worker

## 2023-07-01 DIAGNOSIS — Z3A01 Less than 8 weeks gestation of pregnancy: Secondary | ICD-10-CM | POA: Diagnosis not present

## 2023-07-01 DIAGNOSIS — R509 Fever, unspecified: Secondary | ICD-10-CM | POA: Diagnosis not present

## 2023-07-01 DIAGNOSIS — R0602 Shortness of breath: Secondary | ICD-10-CM | POA: Diagnosis not present

## 2023-07-01 DIAGNOSIS — K7689 Other specified diseases of liver: Secondary | ICD-10-CM | POA: Diagnosis not present

## 2023-07-01 DIAGNOSIS — O0387 Sepsis following complete or unspecified spontaneous abortion: Secondary | ICD-10-CM | POA: Diagnosis not present

## 2023-07-01 DIAGNOSIS — R000 Tachycardia, unspecified: Secondary | ICD-10-CM | POA: Diagnosis not present

## 2023-07-02 DIAGNOSIS — O0387 Sepsis following complete or unspecified spontaneous abortion: Secondary | ICD-10-CM | POA: Diagnosis not present

## 2023-07-02 DIAGNOSIS — K7689 Other specified diseases of liver: Secondary | ICD-10-CM | POA: Diagnosis not present

## 2023-07-02 DIAGNOSIS — Z3A01 Less than 8 weeks gestation of pregnancy: Secondary | ICD-10-CM | POA: Diagnosis not present

## 2023-07-02 DIAGNOSIS — R000 Tachycardia, unspecified: Secondary | ICD-10-CM | POA: Diagnosis not present

## 2023-07-02 LAB — GC/CHLAMYDIA PROBE AMP: Chlamydia, Swab/Urine, PCR: NEGATIVE

## 2023-07-03 DIAGNOSIS — R000 Tachycardia, unspecified: Secondary | ICD-10-CM | POA: Diagnosis not present

## 2023-07-03 DIAGNOSIS — O99891 Other specified diseases and conditions complicating pregnancy: Secondary | ICD-10-CM | POA: Diagnosis not present

## 2023-07-03 DIAGNOSIS — R071 Chest pain on breathing: Secondary | ICD-10-CM | POA: Diagnosis not present

## 2023-07-03 DIAGNOSIS — K921 Melena: Secondary | ICD-10-CM | POA: Diagnosis not present

## 2023-07-03 DIAGNOSIS — O99611 Diseases of the digestive system complicating pregnancy, first trimester: Secondary | ICD-10-CM | POA: Diagnosis not present

## 2023-07-08 ENCOUNTER — Inpatient Hospital Stay (HOSPITAL_COMMUNITY)

## 2023-07-08 ENCOUNTER — Other Ambulatory Visit: Payer: Self-pay

## 2023-07-08 ENCOUNTER — Inpatient Hospital Stay (HOSPITAL_COMMUNITY)
Admission: AD | Admit: 2023-07-08 | Discharge: 2023-07-08 | Discharge status: Against Medical Advice | Attending: Obstetrics and Gynecology | Admitting: Obstetrics and Gynecology

## 2023-07-08 ENCOUNTER — Encounter (HOSPITAL_COMMUNITY): Payer: Self-pay | Admitting: Obstetrics and Gynecology

## 2023-07-08 DIAGNOSIS — O209 Hemorrhage in early pregnancy, unspecified: Secondary | ICD-10-CM | POA: Insufficient documentation

## 2023-07-08 DIAGNOSIS — O09291 Supervision of pregnancy with other poor reproductive or obstetric history, first trimester: Secondary | ICD-10-CM | POA: Diagnosis not present

## 2023-07-08 DIAGNOSIS — Z3A00 Weeks of gestation of pregnancy not specified: Secondary | ICD-10-CM | POA: Diagnosis not present

## 2023-07-08 DIAGNOSIS — O26899 Other specified pregnancy related conditions, unspecified trimester: Secondary | ICD-10-CM | POA: Diagnosis not present

## 2023-07-08 DIAGNOSIS — Z3A01 Less than 8 weeks gestation of pregnancy: Secondary | ICD-10-CM | POA: Diagnosis not present

## 2023-07-08 DIAGNOSIS — Z5329 Procedure and treatment not carried out because of patient's decision for other reasons: Secondary | ICD-10-CM | POA: Diagnosis not present

## 2023-07-08 DIAGNOSIS — O3680X0 Pregnancy with inconclusive fetal viability, not applicable or unspecified: Secondary | ICD-10-CM | POA: Insufficient documentation

## 2023-07-08 LAB — WET PREP, GENITAL
Clue Cells Wet Prep HPF POC: NONE SEEN
Sperm: NONE SEEN
Trich, Wet Prep: NONE SEEN
WBC, Wet Prep HPF POC: <10 (ref <10)
Yeast Wet Prep HPF POC: NONE SEEN

## 2023-07-08 LAB — POCT PREGNANCY, URINE: Preg Test, Ur: POSITIVE — AB

## 2023-07-08 LAB — HCG, QUANTITATIVE, PREGNANCY: hCG, Beta Chain, Quant, S: 59 m[IU]/mL — ABNORMAL HIGH (ref <5)

## 2023-07-08 NOTE — MAU Note (Signed)
 Samantha Andrews is a 28 y.o. at Unknown here in MAU reporting: she has been having lower abdominal cramping for one week and began having scant VB yesterday.  Reports VB is noted with wiping and is light pink.  Also reports cramping has improved with Tylenol, last took today @ 1100.  States had a faint +HPT, but a negative test at the hospital.  LMP: 06/02/2023 Onset of complaint: yesterday Pain score: 8 Vitals:   07/08/23 1424  BP: 129/75  Pulse: 94  Resp: 19  Temp: 98.6 F (37 C)  SpO2: 100%     FHT: NA  Lab orders placed from triage: UPT

## 2023-07-08 NOTE — MAU Provider Note (Addendum)
 History     CSN: [id number]  Arrival date and time: 07/08/23 1408   None     Chief Complaint  Patient presents with   Abdominal Pain   Vaginal Bleeding   HPI  Samantha Andrews is a 28 y.o. female G2P0030 @ [redacted]w[redacted]d here in MAU with complaints of vaginal bleeding. The bleeding was bed in color and a small amount. She denies bleeding at this time. Reports SAB x 2 with one ectopic. She has no pain at this time.   Reports her Hcg level was 13 a few days ago.   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  Social History   Tobacco Use   Smoking status: Every Day    Current packs/day: 0.25    Average packs/day: 0.3 packs/day for 13.0 years (3.3 ttl pk-yrs)    Types: Cigarettes   Smokeless tobacco: Never  Substance Use Topics   Alcohol use: No   Drug use: Yes    Types: Marijuana    Allergies: No Known Allergies  Medications Prior to Admission  Medication Sig Dispense Refill Last Dose/Taking   busPIRone  (BUSPAR ) 10 MG tablet Take 1 tablet (10 mg total) by mouth 2 (two) times daily. 60 tablet 2    FLUoxetine  (PROZAC ) 20 MG capsule Take 1 capsule (20 mg total) by mouth daily. 30 capsule 2    hydrOXYzine  (ATARAX ) 25 MG tablet Take 1 tablet (25 mg total) by mouth 2 (two) times daily as needed for anxiety (use as needed when you have increased anxiety, panic attacks). 30 tablet 2    methocarbamol  (ROBAXIN ) 500 MG tablet Take 1 tablet (500 mg total) by mouth 2 (two) times daily. (Patient not taking: Reported on 03/27/2023) 20 tablet 0    ondansetron  (ZOFRAN -ODT) 4 MG disintegrating tablet  Take 1-2 tablets (4-8 mg total) by mouth every 8 (eight) hours as needed. 20 tablet 0    Probiotic Product (PROBIOTIC DAILY) CAPS Take 1 capsule by mouth daily. (Patient not taking: Reported on 03/27/2023) 30 capsule 2    Results for orders placed or performed during the hospital encounter of 07/08/23 (from the past 48 hours)  Pregnancy, urine POC     Status: Abnormal   Collection Time: 07/08/23  2:39 PM  Result Value Ref Range   Preg Test, Ur POSITIVE (A) NEGATIVE    Comment:        THE SENSITIVITY OF THIS METHODOLOGY IS >20 mIU/mL.   hCG, quantitative, pregnancy     Status: Abnormal   Collection Time: 07/08/23  2:42 PM  Result Value Ref Range   hCG, Beta Chain, Quant, S 59 (H) <5 mIU/mL    Comment:          GEST. AGE  CONC.  (mIU/mL)   <=1 WEEK        5 - 50     2 WEEKS       50 - 500     3 WEEKS       100 - 10,000     4 WEEKS     1,000 - 30,000     5 WEEKS     3,500 - 115,000   6-8 WEEKS     12,000 - 270,000    12 WEEKS     15,000 - 220,000        FEMALE AND NON-PREGNANT FEMALE:     LESS THAN 5 mIU/mL Performed at [facility], [address] [place], [place] [address]     Results for orders placed or performed during the hospital encounter of 07/08/23 (from the past 48 hours)  Pregnancy, urine POC     Status: Abnormal   Collection Time: 07/08/23  2:39 PM  Result Value Ref Range   Preg Test, Ur POSITIVE (A) NEGATIVE    Comment:        THE SENSITIVITY OF THIS METHODOLOGY IS >20 mIU/mL.   hCG, quantitative, pregnancy     Status: Abnormal   Collection Time: 07/08/23  2:42 PM  Result Value Ref Range   hCG, Beta Chain, Quant, S 59 (H) <5 mIU/mL    Comment:          GEST. AGE      CONC.  (mIU/mL)   <=1 WEEK        5 - 50     2 WEEKS       50 - 500     3 WEEKS       100 - 10,000     4 WEEKS     1,000 - 30,000     5 WEEKS     3,500 - 115,000   6-8 WEEKS     12,000 - 270,000    12 WEEKS     15,000 - 220,000        FEMALE AND NON-PREGNANT FEMALE:     LESS THAN 5  mIU/mL Performed at [facility], [address], [place], [place] [address]   Wet prep, genital     Status: None   Collection Time: 07/08/23  5:04 PM  Result Value Ref Range   Yeast Wet Prep HPF POC NONE SEEN NONE SEEN   Trich, Wet Prep NONE SEEN NONE SEEN   Clue Cells Wet Prep HPF POC NONE SEEN NONE SEEN   WBC, Wet Prep HPF POC <10 <10   Sperm NONE SEEN     Comment: Performed at [facility], [address] [place], [place] [address]    Review of Systems  Constitutional:  Negative for fever.  Gastrointestinal:  Negative for abdominal pain.  Genitourinary:  Positive for vaginal bleeding.   Physical Exam   Blood pressure 129/75, pulse 94, temperature 98.6 F (37 C), temperature source Oral, resp. rate 19, height 5' 4 (1.626 m), weight 86 kg, last menstrual period 06/02/2023, SpO2 100%.  Physical Exam Constitutional:      General: She is not in acute distress.    Appearance: She is well-developed. She is not ill-appearing, toxic-appearing or diaphoretic.   Skin:    General: Skin is warm.   Neurological:     Mental Status: She is alert and oriented to person, place, and time.    MAU Course  Procedures  MDM  O positive blood type.  Hcg 59- history of ectopic US  transvaginal ordered Report given to C.Marylen CNM who resumes care of the patient.  Rasch, Delon FERNS, NP  Patient left AMA after ultrasound, did not want to wait for results to return.   Assessment and Plan   1. Left against medical advice    -Patient left AMA  Aleck CHRISTELLA Marylen [facility] 07/08/23 6:34 PM

## 2023-07-09 ENCOUNTER — Ambulatory Visit (HOSPITAL_COMMUNITY): Admitting: Licensed Clinical Social Worker

## 2023-07-09 ENCOUNTER — Encounter (HOSPITAL_COMMUNITY): Payer: Self-pay

## 2023-07-09 LAB — GC/CHLAMYDIA PROBE AMP (~~LOC~~) NOT AT ARMC
Chlamydia: NEGATIVE
Comment: NEGATIVE
Comment: NORMAL
Neisseria Gonorrhea: NEGATIVE
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
## 2023-07-18 ENCOUNTER — Encounter (HOSPITAL_COMMUNITY): Admitting: Psychiatry

## 2023-08-05 DIAGNOSIS — K921 Melena: Secondary | ICD-10-CM | POA: Diagnosis not present

## 2023-08-05 DIAGNOSIS — R590 Localized enlarged lymph nodes: Secondary | ICD-10-CM | POA: Diagnosis not present

## 2023-08-05 DIAGNOSIS — N898 Other specified noninflammatory disorders of vagina: Secondary | ICD-10-CM | POA: Diagnosis not present

## 2023-08-05 DIAGNOSIS — K610 Anal abscess: Secondary | ICD-10-CM | POA: Diagnosis not present

## 2023-08-05 DIAGNOSIS — K611 Rectal abscess: Secondary | ICD-10-CM | POA: Diagnosis not present

## 2023-08-29 ENCOUNTER — Ambulatory Visit: Admitting: Nurse Practitioner

## 2023-09-25 ENCOUNTER — Ambulatory Visit (INDEPENDENT_AMBULATORY_CARE_PROVIDER_SITE_OTHER): Admitting: Obstetrics and Gynecology

## 2023-09-25 ENCOUNTER — Other Ambulatory Visit: Payer: Self-pay

## 2023-09-25 ENCOUNTER — Other Ambulatory Visit (HOSPITAL_COMMUNITY)
Admission: RE | Admit: 2023-09-25 | Discharge: 2023-09-25 | Disposition: A | Discharge status: Home / Self Care | Source: Ambulatory Visit | Attending: Family Medicine | Admitting: Family Medicine

## 2023-09-25 VITALS — BP 132/77 | HR 85 | Ht 63.0 in | Wt 186.0 lb

## 2023-09-25 DIAGNOSIS — Z124 Encounter for screening for malignant neoplasm of cervix: Secondary | ICD-10-CM | POA: Insufficient documentation

## 2023-09-25 DIAGNOSIS — Z01419 Encounter for gynecological examination (general) (routine) without abnormal findings: Secondary | ICD-10-CM

## 2023-09-25 DIAGNOSIS — N96 Recurrent pregnancy loss: Secondary | ICD-10-CM | POA: Diagnosis not present

## 2023-09-25 NOTE — Progress Notes (Signed)
 GYNECOLOGY ANNUAL PREVENTATIVE CARE ENCOUNTER NOTE  History:     Samantha Andrews is a 28 y.o. G25P0040 female here for a routine annual gynecologic exam.  Current complaints: occasionally irregular menses.   Denies discharge, pelvic pain, problems with intercourse or other gynecologic concerns.   Menarche at age 56 mostly regular menses, occs skips months, menses last 5-6 days Pt has lost about 50 pounds in the last few months with diet and exercise. Gynecologic History Patient's last menstrual period was 09/16/2023 (exact date). 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   No current facility-administered medications on file prior to visit.    No Known Allergies  Social History:  reports that she has quit smoking. Her smoking use included cigarettes. She has a 3.3 pack-year smoking history. She has never used smokeless tobacco. She reports current drug use. Drug: Marijuana. She reports that she does not drink alcohol.  No family history on file.  The following portions of the patient's history were reviewed and updated as appropriate: allergies, current medications, past family history, past medical history, past social history, past surgical history and problem list.  Review of Systems Pertinent items noted in HPI and remainder of comprehensive ROS otherwise negative.  Physical Exam:   BP 132/77   Pulse 85   Ht 5' 3 (1.6 m)   Wt 186 lb (84.4 kg)   LMP 09/16/2023 (Exact Date)   Breastfeeding No   BMI 32.95 kg/m  CONSTITUTIONAL: Well-developed, well-nourished female in no acute distress.  HENT:  Normocephalic, atraumatic, External right and left ear normal. Oropharynx is clear and moist EYES: Conjunctivae and EOM are normal.  NECK: Normal range of motion, supple, no masses.  Normal thyroid.  SKIN: Skin is warm and dry. No rash noted. Not diaphoretic. No erythema. No pallor. MUSCULOSKELETAL: Normal range of motion. No tenderness.  No cyanosis, clubbing, or edema.  2+ distal pulses. NEUROLOGIC: Alert and oriented to person, place, and time. Normal reflexes, muscle tone coordination.  PSYCHIATRIC: Normal mood and affect. Normal behavior. Normal judgment and thought content. CARDIOVASCULAR: Normal heart rate noted, regular rhythm RESPIRATORY: Clear to auscultation bilaterally. Effort and breath sounds normal, no problems with respiration noted. BREASTS: deferred ABDOMEN: Soft, no distention noted.  No tenderness, rebound or guarding.  PELVIC: Normal appearing external genitalia and urethral meatus; normal appearing vaginal mucosa and cervix.  No abnormal discharge noted.  Pap smear obtained.  Normal uterine size, no other palpable masses, no uterine or adnexal tenderness.  Performed in the presence of a chaperone.   Assessment and Plan:  1. Women's annual routine gynecological examination (Primary) Normal annual exam  2. 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  - OB Complications Profile  Will follow up results of pap smear and manage accordingly. Routine preventative health maintenance measures emphasized. Please refer to After Visit Summary for other counseling recommendations.      Jerilynn Buddle, MD, FACOG Obstetrician  & Gynecologist, [facility] for [facility]

## 2023-09-27 LAB — CYTOLOGY - PAP: Diagnosis: NEGATIVE

## 2023-09-30 ENCOUNTER — Ambulatory Visit: Payer: Self-pay | Admitting: Obstetrics and Gynecology

## 2023-10-16 LAB — OB COMPLICATIONS PROFILE
APTT 1:1 NP: 29 s
APTT 1:1 Saline: 41.2 s
APTT: 35.2 s — ABNORMAL HIGH
AT III Act/Nor PPP Chro: 96 %
Anticardiolipin Ab, IgA: <10 [APL'U]
Anticardiolipin Ab, IgG: <10 [GPL'U]
Anticardiolipin Ab, IgM: 13 [MPL'U]
Antiphosphatidylserine IgG: 0 {GPS'U}
Antiphosphatidylserine IgM: 0 {MPS'U}
Beta-2 Glycoprotein I, IgA: <10 SAU
Beta-2 Glycoprotein I, IgG: <10 SGU
Beta-2 Glycoprotein I, IgM: <10 SMU
DRVVT Screen Seconds: 26.3 s
Factor XIII Activity**: 218 % — ABNORMAL HIGH
Hexagonal Phospholipid Neutral: 1 s
Homocysteine: 7.2 umol/L
PAI-1 Activity: <4.4 [IU]/mL
Protein S Antigen, Free: 59 %
Prt C Activity (Chromogenic): 75 %

## 2023-11-11 ENCOUNTER — Encounter: Payer: Self-pay | Admitting: Radiology
